# Patient Record
Sex: Male | Born: 2000 | Race: Black or African American | Hispanic: No | Marital: Single | Smoking: Never smoker
Health system: Southern US, Community
[De-identification: ages and names within clinical notes are randomized; demographics above are authoritative.]

## PROBLEM LIST (undated history)

## (undated) DIAGNOSIS — Z87438 Personal history of other diseases of male genital organs: Secondary | ICD-10-CM

## (undated) DIAGNOSIS — J3501 Chronic tonsillitis: Secondary | ICD-10-CM

---
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
## 2018-02-09 DIAGNOSIS — Z7251 High risk heterosexual behavior: Secondary | ICD-10-CM | POA: Diagnosis not present

## 2018-02-09 DIAGNOSIS — Z113 Encounter for screening for infections with a predominantly sexual mode of transmission: Secondary | ICD-10-CM | POA: Diagnosis not present

## 2018-02-09 DIAGNOSIS — Z114 Encounter for screening for human immunodeficiency virus [HIV]: Secondary | ICD-10-CM | POA: Diagnosis not present

## 2018-04-22 DIAGNOSIS — H6693 Otitis media, unspecified, bilateral: Secondary | ICD-10-CM | POA: Diagnosis not present

## 2018-04-22 DIAGNOSIS — J039 Acute tonsillitis, unspecified: Secondary | ICD-10-CM | POA: Diagnosis not present

## 2018-10-29 ENCOUNTER — Encounter (INDEPENDENT_AMBULATORY_CARE_PROVIDER_SITE_OTHER): Payer: Self-pay

## 2018-10-29 ENCOUNTER — Other Ambulatory Visit: Payer: Self-pay | Admitting: Primary Care

## 2018-10-29 ENCOUNTER — Other Ambulatory Visit: Payer: Self-pay

## 2018-10-29 ENCOUNTER — Encounter: Payer: Self-pay | Admitting: Primary Care

## 2018-10-29 ENCOUNTER — Ambulatory Visit (INDEPENDENT_AMBULATORY_CARE_PROVIDER_SITE_OTHER): Payer: No Typology Code available for payment source | Admitting: Primary Care

## 2018-10-29 VITALS — BP 128/74 | HR 48 | Temp 96.7°F | Ht 73.25 in | Wt 242.8 lb

## 2018-10-29 DIAGNOSIS — B081 Molluscum contagiosum: Secondary | ICD-10-CM | POA: Diagnosis not present

## 2018-10-29 DIAGNOSIS — N4889 Other specified disorders of penis: Secondary | ICD-10-CM | POA: Diagnosis not present

## 2018-10-29 DIAGNOSIS — N5089 Other specified disorders of the male genital organs: Secondary | ICD-10-CM | POA: Diagnosis not present

## 2018-10-29 DIAGNOSIS — Z113 Encounter for screening for infections with a predominantly sexual mode of transmission: Secondary | ICD-10-CM | POA: Diagnosis not present

## 2018-10-29 DIAGNOSIS — R238 Other skin changes: Secondary | ICD-10-CM

## 2018-10-29 NOTE — Progress Notes (Signed)
Subjective:    Patient ID: Paul Bridges, male    DOB: [birth year], 18 y.o.   MRN: [id number]  HPI  Paul Bridges is a 18 year old male who presents today to establish care and discuss the problems mentioned below. Will obtain/review records.  1) Molluscum Contagiosum: Diagnosed originally in March 2020 as genital warts at [facility] [employer]. At the health center they attempted cryotherapy which didn't help. He was then treated by the health department with trentoin cream with resolve in the bumps.  He does notice pain to the shaft of his penis after recovering from an erection. This has been since March 2020.  2) Testicular Mass: First noticed in March 2020 with left upper testicular bump. The mass is not painful and has not decreased in size. He has noticed that the left testicle is smaller which is not normal.  Review of Systems  Constitutional: Negative for fever.  Eyes: Negative for visual disturbance.  Respiratory: Negative for shortness of breath.   Cardiovascular: Negative for chest pain.  Gastrointestinal: Negative for abdominal pain.  Endocrine: Negative for polyuria.  Genitourinary: Negative for dysuria, hematuria, penile pain, scrotal swelling and testicular pain.       Dry skin to penile shaft, burning to skin when coming out of an erection.   Skin: Negative for color change and rash.  Allergic/Immunologic: Negative for environmental allergies.  Hematological: Negative for adenopathy.       History reviewed. No pertinent past medical history.   Social History   Socioeconomic History  . Marital status: Single    Spouse name: Not on file  . Number of children: Not on file  . Years of education: Not on file  . Highest education level: Not on file  Occupational History  . Not on file  Social Needs  . Financial resource strain: Not on file  . Food insecurity    Worry: Not on file    Inability: Not on file  . Transportation needs     Medical: Not on file    Non-medical: Not on file  Tobacco Use  . Smoking status: Never Smoker  . Smokeless tobacco: Never Used  Substance and Sexual Activity  . Alcohol use: No  . Drug use: No  . Sexual activity: Never    Birth control/protection: Abstinence  Lifestyle  . Physical activity    Days per week: Not on file    Minutes per session: Not on file  . Stress: Not on file  Relationships  . Social Musicianconnections    Talks on phone: Not on file    Gets together: Not on file    Attends religious service: Not on file    Active member of club or organization: Not on file    Attends meetings of clubs or organizations: Not on file    Relationship status: Not on file  . Intimate partner violence    Fear of current or ex partner: Not on file    Emotionally abused: Not on file    Physically abused: Not on file    Forced sexual activity: Not on file  Other Topics Concern  . Not on file  Social History Narrative   Single.   No children.   Works in Clinical biochemistcustomer service.   [facility] [employer]    History reviewed. No pertinent surgical history.  Family History  Problem Relation Age of Onset  . Diabetes Mother   . Hyperlipidemia Mother   .  Hypertension Mother     No Known Allergies  Current Outpatient Medications on File Prior to Visit  Medication Sig Dispense Refill  . vitamin B-12 (CYANOCOBALAMIN) 1000 MCG tablet Take 1,000 mcg by mouth daily.     No current facility-administered medications on file prior to visit.     BP 128/74 (BP Location: Right Arm, Patient Position: Sitting, Cuff Size: Large)   Pulse (!) 48   Temp (!) 96.7 F (35.9 C) (Tympanic)   Ht 6' 1.25" (1.861 m)   Wt 242 lb 12 oz (110.1 kg)   SpO2 97%   BMI 31.81 kg/m    Objective:   Physical Exam  Constitutional: He is oriented to person, place, and time. He appears well-nourished.  Neck: Neck supple.  Cardiovascular: Normal rate and regular rhythm.  Respiratory: Effort normal and  breath sounds normal.  Genitourinary: Right testis shows no mass, no swelling and no tenderness. Left testis shows no mass, no swelling and no tenderness. No penile erythema. No discharge found.    Genitourinary Comments: No lesions, bumps, dry skin noted to penis. Chaperone present.   Neurological: He is alert and oriented to person, place, and time.  Skin: Skin is warm and dry.  Psychiatric: He has a normal mood and affect.           Assessment & Plan:

## 2018-10-29 NOTE — Assessment & Plan Note (Signed)
Diagnosed earlier in 2020, resolved with Trention per health department. No evidence on exam today.

## 2018-10-29 NOTE — Patient Instructions (Addendum)
Stop by the lab prior to leaving today. I will notify you of your results once received.   You will be contacted regarding your ultrasound.  Please let us know if you have not been contacted within one week.   You will be contacted regarding your referral to dermatology.  Please let us know if you have not been contacted within one week.   It was a pleasure to meet you today! Please don't hesitate to call or message me with any questions. Welcome to [facility]!

## 2018-10-29 NOTE — Assessment & Plan Note (Signed)
Per patient x several months. No obvious masses noted on exam, chaperone present. Ultrasound of testicles pending.

## 2018-10-29 NOTE — Assessment & Plan Note (Signed)
Asymptomatic. Labs pending. 

## 2018-10-29 NOTE — Assessment & Plan Note (Signed)
To the skin of the shaft of his penis when coming out of an erection, also with dry skin of the shaft. Patient wanting to see dermatologist. Referral placed.

## 2018-11-02 LAB — HSV(HERPES SIMPLEX VRS) I + II AB-IGG
HAV 1 IGG,TYPE SPECIFIC AB: <0.9 index
HSV 2 IGG,TYPE SPECIFIC AB: <0.9 index

## 2018-11-02 LAB — HIV ANTIBODY (ROUTINE TESTING W REFLEX): HIV 1&2 Ab, 4th Generation: NONREACTIVE

## 2018-11-02 LAB — C. TRACHOMATIS/N. GONORRHOEAE RNA
C. trachomatis RNA, TMA: NOT DETECTED
N. gonorrhoeae RNA, TMA: NOT DETECTED

## 2018-11-02 LAB — HSV 1/2 AB (IGM), IFA W/RFLX TITER
HSV 1 IgM Screen: NEGATIVE
HSV 2 IgM Screen: NEGATIVE

## 2018-11-02 LAB — RPR: RPR Ser Ql: NONREACTIVE

## 2018-11-05 ENCOUNTER — Other Ambulatory Visit: Payer: Self-pay

## 2018-11-05 ENCOUNTER — Ambulatory Visit (HOSPITAL_COMMUNITY)
Admission: RE | Admit: 2018-11-05 | Discharge: 2018-11-05 | Disposition: A | Discharge status: Home / Self Care | Payer: No Typology Code available for payment source | Source: Ambulatory Visit | Attending: Primary Care | Admitting: Primary Care

## 2018-11-05 DIAGNOSIS — N5089 Other specified disorders of the male genital organs: Secondary | ICD-10-CM | POA: Insufficient documentation

## 2018-11-10 ENCOUNTER — Telehealth: Payer: Self-pay | Admitting: Primary Care

## 2018-11-10 NOTE — Telephone Encounter (Signed)
Best number [phone] Pt returned yoru call

## 2018-11-10 NOTE — Telephone Encounter (Signed)
Addressed in result note.  

## 2018-12-30 ENCOUNTER — Other Ambulatory Visit: Payer: Self-pay

## 2018-12-30 ENCOUNTER — Ambulatory Visit: Payer: Self-pay | Admitting: Nurse Practitioner

## 2018-12-30 ENCOUNTER — Encounter: Payer: Self-pay | Admitting: Nurse Practitioner

## 2018-12-30 DIAGNOSIS — Z113 Encounter for screening for infections with a predominantly sexual mode of transmission: Secondary | ICD-10-CM

## 2018-12-30 LAB — GRAM STAIN

## 2018-12-30 NOTE — Progress Notes (Signed)
Here today for STD screening. Accepts bloodwork. Hal Morales, RN Gram Stain results reviewed. No treatment indicated per standing order. Hal Morales, RN

## 2018-12-30 NOTE — Progress Notes (Signed)
STI clinic/screening visit  Subjective:  Paul Bridges is a 18 y.o. male being seen today for an STI screening visit. The patient reports they do not have symptoms.  Patient has the following medical conditions:   Patient Active Problem List   Diagnosis Date Noted  . Molluscum contagiosum 10/29/2018  . Penile pain 10/29/2018  . Testicular mass 10/29/2018     No chief complaint on file.   Any questions or concerns before we get started - Here for STD testing - denies any symptoms at this time  Client asked the following questions for STD screening:  Allergies: NKDA Previous Surgeries: denies Solvay - denis Medications - deniis Contact to STD - denies Antibiotics in last 2 wks  - denies Last HIV test - 2 months ago Prior STD - denies Immunizations UTD - yes Last sex  - 12/27/2018 X last 2 wks - 5 Partners Last 2 months - 2 - denies condom use Type of sex - penis Smoke - denies ETOH - weekends IVD - denies MJ use - every other day Travel in last 3 months - denies Abuse hx - denies  Last urination - 1 1/2 hrs ago   Patient reports - desire for STD testing  See flowsheet for further details and programmatic requirements.    The following portions of the patient's history were reviewed and updated as appropriate: allergies, current medications, past medical history, past social history, past surgical history and problem list.  Objective:  There were no vitals filed for this visit.  Physical Exam Constitutional:      Appearance: Normal appearance.  HENT:     Head: Normocephalic and atraumatic.     Comments: No nits or hair loss    Mouth/Throat:     Mouth: Mucous membranes are moist.     Pharynx: Oropharynx is clear. No oropharyngeal exudate or posterior oropharyngeal erythema.  Pulmonary:     Effort: Pulmonary effort is normal.  Abdominal:     General: Abdomen is flat.     Palpations: Abdomen is soft. There is no hepatomegaly or mass.     Tenderness:  There is no abdominal tenderness.  Genitourinary:    Pubic Area: No rash or pubic lice.      Penis: Circumcised. Discharge (small amt clear discharge) present.      Scrotum/Testes: Normal.     Epididymis:     Right: Normal.     Left: Normal.  Lymphadenopathy:     Head:     Right side of head: No preauricular or posterior auricular adenopathy.     Left side of head: No preauricular or posterior auricular adenopathy.     Cervical: No cervical adenopathy.     Upper Body:     Right upper body: No supraclavicular or axillary adenopathy.     Left upper body: No supraclavicular or axillary adenopathy.     Lower Body: No right inguinal adenopathy. No left inguinal adenopathy.  Skin:    General: Skin is warm and dry.     Findings: No rash.  Neurological:     Mental Status: He is alert and oriented to person, place, and time.  Psychiatric:        Behavior: Behavior is cooperative.       Assessment and Plan:  Paul Nashton Belson is a 18 y.o. male presenting to the [facility] Department for STI screening  1. Screening examination for STD (sexually transmitted disease) Await STD results  - Gram stain - please  treat per standing order - Chlamydia/Gonorrhea [facility] - Syphilis Serology, [facility] - Gonococcus culture - HIV/HCV [facility]   No follow-ups on file.  No future appointments.  Donn PieriniKarla W Ayden Hardwick, NP

## 2019-01-03 LAB — GONOCOCCUS CULTURE

## 2019-01-26 NOTE — Progress Notes (Signed)
NP did not collect GC/CT. I have removed this from the orders.

## 2019-01-26 NOTE — Addendum Note (Signed)
Addended by: Caryl Bis on: 01/26/2019 03:02 PM   Modules accepted: Orders

## 2019-02-02 ENCOUNTER — Ambulatory Visit: Payer: Self-pay | Admitting: Family Medicine

## 2019-02-02 ENCOUNTER — Encounter: Payer: Self-pay | Admitting: Family Medicine

## 2019-02-02 ENCOUNTER — Other Ambulatory Visit: Payer: Self-pay

## 2019-02-02 DIAGNOSIS — Z113 Encounter for screening for infections with a predominantly sexual mode of transmission: Secondary | ICD-10-CM

## 2019-02-02 LAB — GRAM STAIN

## 2019-02-02 NOTE — Progress Notes (Signed)
    STI clinic/screening visit  Subjective:  Paul Bridges is a 18 y.o. male being seen today for an STI screening visit. The patient reports they do not have symptoms.  Patient has the following medical conditions:   Patient Active Problem List   Diagnosis Date Noted  . Testicular mass 10/29/2018     Chief Complaint  Patient presents with  . SEXUALLY TRANSMITTED DISEASE    HPI  Patient reports here for STD screening.  Denies sympts.  See flowsheet for further details and programmatic requirements.    The following portions of the patient's history were reviewed and updated as appropriate: allergies, current medications, past medical history, past social history, past surgical history and problem list.  Objective:  There were no vitals filed for this visit.  Physical Exam Constitutional:      Appearance: Normal appearance.  HENT:     Mouth/Throat:     Pharynx: Oropharynx is clear.  Neck:     Vascular: No carotid bruit.  Abdominal:     Tenderness: There is no abdominal tenderness.  Genitourinary:    Penis: Normal.      Scrotum/Testes: Normal.     Comments: No inguinal adenopathy R testicle -smooth, no mass palpated Lymphadenopathy:     Cervical: No cervical adenopathy.  Skin:    General: Skin is warm and dry.     Findings: No lesion or rash.  Neurological:     Mental Status: He is alert.    Assessment and Plan:  Paul Bridges is a 18 y.o. male presenting to the [facility] Department for STI screening  1. Screening examination for venereal disease  - HIV [place] [facility] - Syphilis Serology, [employer] [facility] - Gram stain - Gonococcus culture Co. To use condoms always     No follow-ups on file.  No future appointments.  Hassell Done, FNP

## 2019-02-02 NOTE — Progress Notes (Signed)
Here today for STD screening. Accepts bloodwork. Hal Morales, RN Gram Stain results reviewed. No treatment indicated per standing orders. Hal Morales, RN

## 2019-02-07 LAB — GONOCOCCUS CULTURE

## 2019-03-04 ENCOUNTER — Other Ambulatory Visit: Payer: Self-pay

## 2019-03-04 ENCOUNTER — Encounter: Payer: Self-pay | Admitting: Physician Assistant

## 2019-03-04 ENCOUNTER — Ambulatory Visit: Payer: Self-pay | Admitting: Physician Assistant

## 2019-03-04 DIAGNOSIS — Z113 Encounter for screening for infections with a predominantly sexual mode of transmission: Secondary | ICD-10-CM

## 2019-03-04 LAB — GRAM STAIN

## 2019-03-04 NOTE — Progress Notes (Signed)
    STI clinic/screening visit  Subjective:  Paul Bridges is a 18 y.o. male being seen today for an STI screening visit. The patient reports they do not have symptoms.  Patient has the following medical conditions:   Patient Active Problem List   Diagnosis Date Noted  . Testicular mass 10/29/2018     Chief Complaint  Patient presents with  . Exposure to STD    HPI  Patient reports desire for STI screen only. Declines bloodwork.  See flowsheet for further details and programmatic requirements.    The following portions of the patient's history were reviewed and updated as appropriate: allergies, current medications, past medical history, past social history, past surgical history and problem list.  Objective:  There were no vitals filed for this visit.  Physical Exam Constitutional:      Appearance: He is obese.  HENT:     Mouth/Throat:     Mouth: Mucous membranes are moist.     Pharynx: Oropharynx is clear. No oropharyngeal exudate or posterior oropharyngeal erythema.  Pulmonary:     Effort: Pulmonary effort is normal.  Abdominal:     Palpations: Abdomen is soft. There is no mass.     Tenderness: There is no abdominal tenderness.  Genitourinary:    Penis: Normal and circumcised. No tenderness, discharge or swelling.      Scrotum/Testes: Normal.     Epididymis:     Right: Normal.     Left: Normal.  Skin:    General: Skin is warm and dry.     Findings: No rash.  Neurological:     Mental Status: He is alert and oriented to person, place, and time.  Psychiatric:        Mood and Affect: Mood normal.        Behavior: Behavior normal.       Assessment and Plan:  Paul Bridges is a 18 y.o. male presenting to the [facility] Department for STI screening  1. Routine screening for STI (sexually transmitted infection) Gram stain neg. Await GG culture. Strongly recommend condoms for STI prevention and to decrease risk of unwanted  pregnancy. - Gram stain - Gonococcus culture     Return in about 6 months (around 09/02/2019), or routine STI testing, prior if exposure/sx.  No future appointments.  Lora Havens, PA-C

## 2019-03-04 NOTE — Progress Notes (Signed)
Patient here for STD testing.Helia Haese Brewer-Jensen, RN 

## 2019-03-04 NOTE — Progress Notes (Signed)
Gram stain reviewed, no treatment indicated..Makayleigh Poliquin Brewer-Jensen, RN  ?

## 2019-03-09 LAB — GONOCOCCUS CULTURE

## 2019-05-06 ENCOUNTER — Ambulatory Visit: Payer: No Typology Code available for payment source

## 2019-05-11 ENCOUNTER — Encounter: Payer: Self-pay | Admitting: Physician Assistant

## 2019-05-11 ENCOUNTER — Ambulatory Visit: Payer: Self-pay | Admitting: Physician Assistant

## 2019-05-11 ENCOUNTER — Other Ambulatory Visit: Payer: Self-pay

## 2019-05-11 DIAGNOSIS — Z113 Encounter for screening for infections with a predominantly sexual mode of transmission: Secondary | ICD-10-CM

## 2019-05-11 LAB — GRAM STAIN

## 2019-05-11 NOTE — Progress Notes (Signed)
Gram stain reviewed and is negative today, so no treatment needed for gram stain per standing order. Provider orders completed.Damani Kelemen, RN 

## 2019-05-11 NOTE — Progress Notes (Signed)
   [facility] Department STI clinic/screening visit  Subjective:  Paul Bridges is a 19 y.o. male being seen today for an STI screening visit. The patient reports they do not have symptoms.    Patient has the following medical conditions:   Patient Active Problem List   Diagnosis Date Noted  . Testicular mass 10/29/2018     Chief Complaint  Patient presents with  . SEXUALLY TRANSMITTED DISEASE    HPI  Patient reports that he does not have any symptoms but would like to have a screening today.   See flowsheet for further details and programmatic requirements.    The following portions of the patient's history were reviewed and updated as appropriate: allergies, current medications, past medical history, past social history, past surgical history and problem list.  Objective:  There were no vitals filed for this visit.  Physical Exam Constitutional:      General: He is not in acute distress.    Appearance: Normal appearance. He is normal weight.  HENT:     Head: Normocephalic and atraumatic.     Comments: No nits, lice, or hair loss. No cervical, supraclavicular or axillary adenopathy.    Mouth/Throat:     Mouth: Mucous membranes are moist.     Pharynx: Oropharynx is clear. No oropharyngeal exudate or posterior oropharyngeal erythema.  Eyes:     Conjunctiva/sclera: Conjunctivae normal.  Pulmonary:     Effort: Pulmonary effort is normal.  Abdominal:     Palpations: Abdomen is soft. There is no mass.     Tenderness: There is no abdominal tenderness. There is no guarding or rebound.  Genitourinary:    Penis: Normal.      Testes: Normal.     Comments: Pubic area without nits, lice, edema, erythema, lesions and inguinal adenopathy. Penis without lesions, circumcised and without discharge at meatus. Musculoskeletal:     Cervical back: Neck supple. No tenderness.  Skin:    General: Skin is warm and dry.     Findings: No bruising, erythema, lesion  or rash.  Neurological:     Mental Status: He is alert and oriented to person, place, and time.  Psychiatric:        Mood and Affect: Mood normal.        Behavior: Behavior normal.        Thought Content: Thought content normal.        Judgment: Judgment normal.       Assessment and Plan:  Paul Bridges is a 19 y.o. male presenting to the [facility] Department for STI screening  1. Screening for STD (sexually transmitted disease) Patient into clinic without symptoms. Rec condoms with all sex. Await test results.  Counseled that RN will call if needs to RTC for treatment once results are back. - Gram stain - Gonococcus culture - HIV [place] [facility] - Syphilis Serology,  Lab - Gonococcus culture     No follow-ups on file.  No future appointments.  Matt Holmes, PA

## 2019-05-11 NOTE — Progress Notes (Signed)
Pt here for STD screening.Hadasah Brugger, RN 

## 2019-05-16 LAB — GONOCOCCUS CULTURE

## 2019-07-28 ENCOUNTER — Ambulatory Visit: Payer: No Typology Code available for payment source

## 2019-08-02 ENCOUNTER — Other Ambulatory Visit: Payer: Self-pay

## 2019-08-02 ENCOUNTER — Encounter: Payer: Self-pay | Admitting: Family Medicine

## 2019-08-02 ENCOUNTER — Ambulatory Visit: Payer: Self-pay | Admitting: Family Medicine

## 2019-08-02 DIAGNOSIS — Z113 Encounter for screening for infections with a predominantly sexual mode of transmission: Secondary | ICD-10-CM

## 2019-08-02 LAB — GRAM STAIN

## 2019-08-02 NOTE — Progress Notes (Signed)
[facility] Department STI clinic/screening visit  Subjective:  Paul Bridges is a 19 y.o. male being seen today for an STI screening visit. The patient reports they do not have symptoms.    Patient has the following medical conditions:   Patient Active Problem List   Diagnosis Date Noted  . Testicular mass 10/29/2018     Chief Complaint  Patient presents with  . SEXUALLY TRANSMITTED DISEASE    STD screening including bloodwork    HPI  Patient reports no sx. Wants to be checked due to a new partner last week.    See flowsheet for further details and programmatic requirements.    The following portions of the patient's history were reviewed and updated as appropriate: allergies, current medications, past medical history, past social history, past surgical history and problem list.  Objective:  There were no vitals filed for this visit.  Physical Exam Constitutional:      Appearance: Normal appearance.  HENT:     Head: Normocephalic and atraumatic.     Comments: No nits or hair loss    Mouth/Throat:     Mouth: Mucous membranes are moist.     Pharynx: Oropharynx is clear. No oropharyngeal exudate or posterior oropharyngeal erythema.  Pulmonary:     Effort: Pulmonary effort is normal.  Abdominal:     General: Abdomen is flat.     Palpations: Abdomen is soft. There is no hepatomegaly or mass.     Tenderness: There is no abdominal tenderness.  Genitourinary:    Pubic Area: No rash or pubic lice.      Penis: Normal.      Testes: Cremasteric reflex is present.        Right: Mass (known mass-- elvaluated in the past. feels likes varicoceole.) present. Tenderness or swelling not present.        Left: Mass, tenderness or swelling not present.     Epididymis:     Right: Normal.     Left: Normal.     Rectum: Normal.    Lymphadenopathy:     Head:     Right side of head: No preauricular or posterior auricular adenopathy.     Left side of head: No  preauricular or posterior auricular adenopathy.     Cervical: No cervical adenopathy.     Upper Body:     Right upper body: No supraclavicular or axillary adenopathy.     Left upper body: No supraclavicular or axillary adenopathy.     Lower Body: No right inguinal adenopathy. No left inguinal adenopathy.  Skin:    General: Skin is warm and dry.     Findings: No rash.  Neurological:     Mental Status: He is alert and oriented to person, place, and time.       Assessment and Plan:  Paul Bridges is a 19 y.o. male presenting to the [facility] Department for STI screening  1. Screening for STD (sexually transmitted disease) Treat gram stain per standing  Reviewed condom use  Discussed transmission of STIs. Patient was asking about "how long to wait between partners to know you dont have something." and I counseled the only safety between partners is using condoms and/or having testing frequently.   Also discussed that gram stain and GC culture help confirm no gonorrhea but our testing does not include confirmation for chlamydia.  - Gram stain - HIV [place] [facility] - Syphilis Serology, [place] [facility] - Gonococcus culture - Gonococcus culture  Return if symptoms worsen or fail to improve.  No future appointments.  Caren Macadam, MD

## 2019-08-02 NOTE — Progress Notes (Signed)
Gram stain reviewed, no tx per standing order. Provider orders completed. 

## 2019-08-06 ENCOUNTER — Other Ambulatory Visit: Payer: Self-pay

## 2019-08-06 ENCOUNTER — Ambulatory Visit (INDEPENDENT_AMBULATORY_CARE_PROVIDER_SITE_OTHER): Payer: 59 | Admitting: Otolaryngology

## 2019-08-06 ENCOUNTER — Encounter (INDEPENDENT_AMBULATORY_CARE_PROVIDER_SITE_OTHER): Payer: Self-pay | Admitting: Otolaryngology

## 2019-08-06 VITALS — Temp 97.9°F

## 2019-08-06 DIAGNOSIS — J3501 Chronic tonsillitis: Secondary | ICD-10-CM | POA: Diagnosis not present

## 2019-08-06 LAB — GONOCOCCUS CULTURE

## 2019-08-06 NOTE — Progress Notes (Signed)
HPI: Paul Bridges is a 19 y.o. male who presents for evaluation of chronic tonsil problems.  He frequently gets tonsiloliths as well as white debris buildup in the tonsils and occasional sore throats.  He has always had large tonsils at one point was going to have his tonsils removed.  He complains of bad breath as well as frequent tonsil stones and intermittent sore throats. He is otherwise healthy on no medications.  No past medical history on file. No past surgical history on file. Social History   Socioeconomic History  . Marital status: Single    Spouse name: Not on file  . Number of children: Not on file  . Years of education: Not on file  . Highest education level: Not on file  Occupational History  . Not on file  Tobacco Use  . Smoking status: Never Smoker  . Smokeless tobacco: Never Used  Substance and Sexual Activity  . Alcohol use: No  . Drug use: Yes    Types: Marijuana  . Sexual activity: Never    Birth control/protection: Abstinence  Other Topics Concern  . Not on file  Social History Narrative   Single.   No children.   Works in Clinical biochemist.   Student at [employer]   Social Determinants of Health   Financial Resource Strain:   . Difficulty of Paying Living Expenses:   Food Insecurity:   . Worried About Programme researcher, broadcasting/film/video in the Last Year:   . Barista in the Last Year:   Transportation Needs:   . Freight forwarder (Medical):   Marland Kitchen Lack of Transportation (Non-Medical):   Physical Activity:   . Days of Exercise per Week:   . Minutes of Exercise per Session:   Stress:   . Feeling of Stress :   Social Connections:   . Frequency of Communication with Friends and Family:   . Frequency of Social Gatherings with Friends and Family:   . Attends Religious Services:   . Active Member of Clubs or Organizations:   . Attends Banker Meetings:   Marland Kitchen Marital Status:    Family History  Problem Relation Age of Onset   . Diabetes Mother   . Hyperlipidemia Mother   . Hypertension Mother    No Known Allergies Prior to Admission medications   Medication Sig Start Date End Date Taking? Authorizing Provider  Ascorbic Acid (VITAMIN C) 100 MG tablet Take 100 mg by mouth daily.   Yes [provider]  cholecalciferol (VITAMIN D3) 25 MCG (1000 UNIT) tablet Take 1,000 Units by mouth daily.   Yes [provider]  vitamin B-12 (CYANOCOBALAMIN) 1000 MCG tablet Take 1,000 mcg by mouth daily.   Yes [provider]     Positive ROS: Otherwise negative  All other systems have been reviewed and were otherwise negative with the exception of those mentioned in the HPI and as above.  Physical Exam: Constitutional: Alert, well-appearing, no acute distress Ears: External ears without lesions or tenderness. Ear canals are clear bilaterally with intact, clear TMs.  Nasal: External nose without lesions. Septum midline. Clear nasal passages Oral: Lips and gums without lesions. Tongue and palate mucosa without lesions. Posterior oropharynx clear.  Patient with symmetric large 2-3+ size tonsils bilaterally with tonsillar crypts and small tonsillar stones and tonsillar debris. Neck: No palpable adenopathy or masses Respiratory: Breathing comfortably.  Lungs clear to auscultation. Cardiac exam: Regular rate and rhythm without murmur. Skin: No facial/neck lesions  or rash noted.  Procedures  Assessment: Chronic tonsillitis  Plan: Discussed tonsillectomy with the patient today in the office.  Discussed the morbidity and risk of tonsillectomy including postop bleeding. He would like to schedule tonsillectomy.  He is presently in school taking online classes.  Reviewed with him that he will have to be out of school for 8 to 9 days to recover. He will schedule this with our nurse.  Radene Journey, MD

## 2019-08-10 ENCOUNTER — Ambulatory Visit (INDEPENDENT_AMBULATORY_CARE_PROVIDER_SITE_OTHER): Payer: Self-pay | Admitting: Otolaryngology

## 2019-08-10 DIAGNOSIS — J3501 Chronic tonsillitis: Secondary | ICD-10-CM

## 2019-08-10 NOTE — H&P (Signed)
PREOPERATIVE H&P  Chief Complaint: Chronic tonsil problems  HPI: Paul Bridges is a 19 y.o. male who presents for evaluation of chronic tonsil problems.  Patient has frequent tonsil stones.  He is always had large tonsils and has had frequent sore throats.  He frequently cleans the tonsil stones by they keep recurring and cause sore throats as well as a bad breath.  He is taken to the operating room at this time for tonsillectomy.  No past medical history on file. No past surgical history on file. Social History   Socioeconomic History  . Marital status: Single    Spouse name: Not on file  . Number of children: Not on file  . Years of education: Not on file  . Highest education level: Not on file  Occupational History  . Not on file  Tobacco Use  . Smoking status: Never Smoker  . Smokeless tobacco: Never Used  Substance and Sexual Activity  . Alcohol use: No  . Drug use: Yes    Types: Marijuana  . Sexual activity: Never    Birth control/protection: Abstinence  Other Topics Concern  . Not on file  Social History Narrative   Single.   No children.   Works in Clinical biochemist.   Student at [employer]   Social Determinants of Health   Financial Resource Strain:   . Difficulty of Paying Living Expenses:   Food Insecurity:   . Worried About Programme researcher, broadcasting/film/video in the Last Year:   . Barista in the Last Year:   Transportation Needs:   . Freight forwarder (Medical):   Marland Kitchen Lack of Transportation (Non-Medical):   Physical Activity:   . Days of Exercise per Week:   . Minutes of Exercise per Session:   Stress:   . Feeling of Stress :   Social Connections:   . Frequency of Communication with Friends and Family:   . Frequency of Social Gatherings with Friends and Family:   . Attends Religious Services:   . Active Member of Clubs or Organizations:   . Attends Banker Meetings:   Marland Kitchen Marital Status:    Family History  Problem Relation  Age of Onset  . Diabetes Mother   . Hyperlipidemia Mother   . Hypertension Mother    No Known Allergies Prior to Admission medications   Medication Sig Start Date End Date Taking? Authorizing Provider  Ascorbic Acid (VITAMIN C) 100 MG tablet Take 100 mg by mouth daily.    [provider]  cholecalciferol (VITAMIN D3) 25 MCG (1000 UNIT) tablet Take 1,000 Units by mouth daily.    [provider]  vitamin B-12 (CYANOCOBALAMIN) 1000 MCG tablet Take 1,000 mcg by mouth daily.    [provider]     Positive ROS: Otherwise negative  All other systems have been reviewed and were otherwise negative with the exception of those mentioned in the HPI and as above.  Physical Exam: There were no vitals filed for this visit.  General: Alert, no acute distress Oral: Normal oral mucosa and tonsils are 2-3+ bilaterally with tonsillar crypts and white debris. Nasal: Clear nasal passages Neck: No palpable adenopathy or thyroid nodules Ear: Ear canal is clear with normal appearing TMs Cardiovascular: Regular rate and rhythm, no murmur.  Respiratory: Clear to auscultation Neurologic: Alert and oriented x 3   Assessment/Plan: Chronic tonsillitis  Plan for tonsillectomy   Dillard Cannon, MD 08/10/2019 6:35 PM

## 2019-08-16 ENCOUNTER — Other Ambulatory Visit (HOSPITAL_COMMUNITY): Payer: 59 | Attending: Otolaryngology

## 2019-08-16 ENCOUNTER — Encounter (HOSPITAL_BASED_OUTPATIENT_CLINIC_OR_DEPARTMENT_OTHER): Payer: Self-pay | Admitting: Otolaryngology

## 2019-08-17 ENCOUNTER — Encounter (INDEPENDENT_AMBULATORY_CARE_PROVIDER_SITE_OTHER): Payer: Self-pay

## 2019-08-17 NOTE — Progress Notes (Unsigned)
Pt was scheduled for Tonsillectomy on 08/19/2019. This had to be canceled due to pt would not return call to schedule pre-op and COVID testing before surgery. With several attemps to reach pt from my self and Lennox Laity at [facility] for over 2 days, surgery was canceled. Lennox Laity had spoke with pt early on and he stated he was not sure if wanted to get surgery or not and he would call her back to let her know, he never called back. No message were able to be left due to pt had no voice mail box set up. I did call mom's # and e-mailed her at work,[employer], to make sure pt is ok, no reply from her either. PM

## 2019-08-19 ENCOUNTER — Ambulatory Visit (HOSPITAL_BASED_OUTPATIENT_CLINIC_OR_DEPARTMENT_OTHER): Admission: RE | Admit: 2019-08-19 | Payer: 59 | Source: Ambulatory Visit | Admitting: Otolaryngology

## 2019-08-19 ENCOUNTER — Encounter (HOSPITAL_BASED_OUTPATIENT_CLINIC_OR_DEPARTMENT_OTHER): Admission: RE | Payer: Self-pay | Source: Ambulatory Visit

## 2019-08-19 HISTORY — DX: Chronic tonsillitis: J35.01

## 2019-08-19 HISTORY — DX: Personal history of other diseases of male genital organs: Z87.438

## 2019-08-19 SURGERY — TONSILLECTOMY
Anesthesia: General

## 2019-09-03 ENCOUNTER — Encounter: Payer: Self-pay | Admitting: Family Medicine

## 2019-09-03 ENCOUNTER — Other Ambulatory Visit: Payer: Self-pay

## 2019-09-03 ENCOUNTER — Ambulatory Visit: Payer: Self-pay | Admitting: Family Medicine

## 2019-09-03 DIAGNOSIS — N341 Nonspecific urethritis: Secondary | ICD-10-CM

## 2019-09-03 DIAGNOSIS — Z113 Encounter for screening for infections with a predominantly sexual mode of transmission: Secondary | ICD-10-CM

## 2019-09-03 LAB — GRAM STAIN

## 2019-09-03 MED ORDER — AZITHROMYCIN 500 MG PO TABS
1000.0000 mg | ORAL_TABLET | Freq: Once | ORAL | Status: AC
  Administered 2019-09-03: 1000 mg via ORAL

## 2019-09-03 MED ORDER — AZITHROMYCIN 500 MG PO TABS: 500.0000 mg | ORAL_TABLET | Freq: Once | ORAL | Status: DC

## 2019-09-03 NOTE — Progress Notes (Signed)
  [facility] Department STI clinic/screening visit  Subjective:  Paul Bridges is a 19 y.o. male being seen today for  Chief Complaint  Patient presents with  . SEXUALLY TRANSMITTED DISEASE     The patient reports they do not have symptoms.   Patient has the following medical conditions:   Patient Active Problem List   Diagnosis Date Noted  . Testicular mass 10/29/2018    HPI  Pt reports he is here for STI screening, denies symptoms.   See flowsheet for further details and programmatic requirements.    No components found for: HCV  The following portions of the patient's history were reviewed and updated as appropriate: allergies, current medications, past medical history, past social history, past surgical history and problem list.  Objective:  There were no vitals filed for this visit.   Physical Exam Constitutional:      Appearance: Normal appearance.  HENT:     Head: Normocephalic and atraumatic.     Comments: No nits or hair loss    Mouth/Throat:     Mouth: Mucous membranes are moist.     Pharynx: Oropharynx is clear. No oropharyngeal exudate or posterior oropharyngeal erythema.  Pulmonary:     Effort: Pulmonary effort is normal.  Abdominal:     General: Abdomen is flat.     Palpations: Abdomen is soft. There is no hepatomegaly or mass.     Tenderness: There is no abdominal tenderness.  Genitourinary:    Pubic Area: No rash or pubic lice.      Penis: Normal and circumcised.      Testes:        Right: Mass:       Epididymis:     Right: Normal.     Left: Normal.     Rectum: Normal.  Lymphadenopathy:     Head:     Right side of head: No preauricular or posterior auricular adenopathy.     Left side of head: No preauricular or posterior auricular adenopathy.     Cervical: No cervical adenopathy.     Upper Body:     Right upper body: No supraclavicular or axillary adenopathy.     Left upper body: No supraclavicular or axillary  adenopathy.     Lower Body: No right inguinal adenopathy. No left inguinal adenopathy.  Skin:    General: Skin is warm and dry.     Findings: No rash.  Neurological:     Mental Status: He is alert and oriented to person, place, and time.       Assessment and Plan:  Paul Ramsey Guadamuz is a 19 y.o. male presenting to the [facility] Department for STI screening    1. Screening examination for venereal disease -Pt without symptoms. Screenings today as below. Treat gram stain per standing order. -Patient does meet criteria for HepB, HepC Screening. Declines these screenings as well as HIV and syphilis. -Testicular mass noted on problem list but not present on exam today. Testicular US 11/2018 showed 5 mm epididymal cysts versus tiny spermatocele at head of LEFT Epididymis. Small LEFT hydrocele. -Counseled on warning s/sx and when to seek care. Recommended condom use with all sex and discussed importance of condom use for STI prevention. - Gram stain - Gonococcus culture    Return for screening as needed.  No future appointments.  Ann Held, PA-C

## 2019-09-03 NOTE — Progress Notes (Signed)
Gram stain reviewed and treated for NGU per standing orders. Jossie Ng, RN

## 2019-09-07 DIAGNOSIS — Z113 Encounter for screening for infections with a predominantly sexual mode of transmission: Secondary | ICD-10-CM | POA: Diagnosis not present

## 2019-09-07 DIAGNOSIS — Z7253 High risk bisexual behavior: Secondary | ICD-10-CM | POA: Diagnosis not present

## 2019-09-07 DIAGNOSIS — Z7189 Other specified counseling: Secondary | ICD-10-CM | POA: Diagnosis not present

## 2019-09-08 LAB — GONOCOCCUS CULTURE

## 2019-09-21 ENCOUNTER — Ambulatory Visit: Payer: 59 | Admitting: Primary Care

## 2019-09-21 ENCOUNTER — Other Ambulatory Visit: Payer: Self-pay

## 2019-09-21 ENCOUNTER — Encounter: Payer: Self-pay | Admitting: Primary Care

## 2019-09-21 VITALS — BP 124/76 | HR 61 | Temp 96.0°F | Resp 98 | Ht 73.25 in | Wt 254.0 lb

## 2019-09-21 DIAGNOSIS — Z113 Encounter for screening for infections with a predominantly sexual mode of transmission: Secondary | ICD-10-CM | POA: Diagnosis not present

## 2019-09-21 NOTE — Progress Notes (Signed)
Subjective:    Patient ID: Paul Bridges, male    DOB: 2001/03/12, 19 y.o.   MRN: [id number]  HPI  This visit occurred during the SARS-CoV-2 public health emergency.  Safety protocols were in place, including screening questions prior to the visit, additional usage of staff PPE, and extensive cleaning of exam room while observing appropriate contact time as indicated for disinfecting solutions.   Mr. Paul Bridges is a 19 year old male who presents today for STD screening.   He is sexually active with the same partner, but his partner has been sexually active with someone else. He sometimes wears protection with condoms.   He denies symptoms of penile discharge, penile swelling, dysuria, etc. He had blood work for Syphilis, HIV completed at the health department in late April 2021.  BP Readings from Last 3 Encounters:  09/21/19 124/76  10/29/18 128/74  12/14/15 113/55     Review of Systems  Constitutional: Negative for fever.  Gastrointestinal: Negative for abdominal pain.  Genitourinary: Negative for dysuria, frequency, hematuria, penile pain and penile swelling.       Past Medical History:  Diagnosis Date  . Chronic tonsillitis   . H/O testicular mass      Social History   Socioeconomic History  . Marital status: Single    Spouse name: Not on file  . Number of children: Not on file  . Years of education: Not on file  . Highest education level: Not on file  Occupational History  . Not on file  Tobacco Use  . Smoking status: Never Smoker  . Smokeless tobacco: Never Used  Substance and Sexual Activity  . Alcohol use: No  . Drug use: Not Currently    Types: Marijuana    Comment: once every couple months  . Sexual activity: Yes    Partners: Female    Birth control/protection: Condom  Other Topics Concern  . Not on file  Social History Narrative   Single.   No children.   Works in Clinical biochemist.   Student at [employer]   Social Determinants of  Health   Financial Resource Strain:   . Difficulty of Paying Living Expenses:   Food Insecurity:   . Worried About Programme researcher, broadcasting/film/video in the Last Year:   . Barista in the Last Year:   Transportation Needs:   . Freight forwarder (Medical):   Marland Kitchen Lack of Transportation (Non-Medical):   Physical Activity:   . Days of Exercise per Week:   . Minutes of Exercise per Session:   Stress:   . Feeling of Stress :   Social Connections:   . Frequency of Communication with Friends and Family:   . Frequency of Social Gatherings with Friends and Family:   . Attends Religious Services:   . Active Member of Clubs or Organizations:   . Attends Banker Meetings:   Marland Kitchen Marital Status:   Intimate Partner Violence:   . Fear of Current or Ex-Partner:   . Emotionally Abused:   Marland Kitchen Physically Abused:   . Sexually Abused:     No past surgical history on file.  Family History  Problem Relation Age of Onset  . Diabetes Mother   . Hyperlipidemia Mother   . Hypertension Mother     No Known Allergies  Current Outpatient Medications on File Prior to Visit  Medication Sig Dispense Refill  . Ascorbic Acid (VITAMIN C) 100 MG tablet Take 100 mg by mouth  daily.    . cholecalciferol (VITAMIN D3) 25 MCG (1000 UNIT) tablet Take 1,000 Units by mouth daily.    . vitamin B-12 (CYANOCOBALAMIN) 1000 MCG tablet Take 1,000 mcg by mouth daily.     No current facility-administered medications on file prior to visit.    BP 124/76   Pulse 61   Temp (!) 96 F (35.6 C) (Temporal)   Resp (!) 98   Ht 6' 1.25" (1.861 m)   Wt 254 lb (115.2 kg)   BMI 33.28 kg/m    Objective:   Physical Exam  Constitutional: He appears well-nourished.  Cardiovascular: Normal rate and regular rhythm.  Respiratory: Effort normal and breath sounds normal.  Musculoskeletal:     Cervical back: Neck supple.  Skin: Skin is warm and dry.  Psychiatric: He has a normal mood and affect.           Assessment  & Plan:

## 2019-09-21 NOTE — Patient Instructions (Signed)
Stop by the lab prior to leaving today. I will notify you of your results once received.   It was a pleasure to see you today!  

## 2019-09-21 NOTE — Assessment & Plan Note (Signed)
Asymptomatic.  Encouraged use of condoms each time he is sexually active.  Recent lab testing reviewed including HIV, RPR. Urine gonorrhea, chlamydia, trichomonas pending per patient request.

## 2019-09-23 LAB — C. TRACHOMATIS/N. GONORRHOEAE RNA
C. trachomatis RNA, TMA: NOT DETECTED
N. gonorrhoeae RNA, TMA: NOT DETECTED

## 2019-09-23 LAB — TRICHOMONAS VAGINALIS RNA, QL,MALES: Trichomonas vaginalis RNA: NOT DETECTED

## 2019-09-24 ENCOUNTER — Encounter: Payer: Self-pay | Admitting: *Deleted

## 2019-11-01 ENCOUNTER — Ambulatory Visit: Payer: Self-pay | Admitting: Physician Assistant

## 2019-11-01 ENCOUNTER — Other Ambulatory Visit: Payer: Self-pay

## 2019-11-01 DIAGNOSIS — Z113 Encounter for screening for infections with a predominantly sexual mode of transmission: Secondary | ICD-10-CM

## 2019-11-01 NOTE — Progress Notes (Signed)
Gram stain reviewed, no treatment indicated..Paul Conyer Brewer-Jensen, RN  ?

## 2019-11-02 ENCOUNTER — Encounter: Payer: Self-pay | Admitting: Physician Assistant

## 2019-11-02 LAB — GRAM STAIN

## 2019-11-02 NOTE — Progress Notes (Signed)
   [facility] Department STI clinic/screening visit  Subjective:  Paul Bridges is a 19 y.o. male being seen today for an STI screening visit. The patient reports they do not have symptoms.    Patient has the following medical conditions:   Patient Active Problem List   Diagnosis Date Noted  . Testicular mass 10/29/2018  . Screening for STD (sexually transmitted disease) 10/29/2018     Chief Complaint  Patient presents with  . SEXUALLY TRANSMITTED DISEASE    screening    HPI  Patient reports that he does not have any symptoms but would like a screening today.  Denies chronic conditions, surgeries and regular medicines.  States last HIV test was 05/2019.  Last void prior to sample collection for Gram stain was over 2 hr ago per patient.    See flowsheet for further details and programmatic requirements.    The following portions of the patient's history were reviewed and updated as appropriate: allergies, current medications, past medical history, past social history, past surgical history and problem list.  Objective:  There were no vitals filed for this visit.  Physical Exam Constitutional:      General: He is not in acute distress.    Appearance: Normal appearance.  HENT:     Head: Normocephalic and atraumatic.     Comments: No nits, lice or hair loss. No cervical, supraclavicular or axillary adenopathy.    Mouth/Throat:     Mouth: Mucous membranes are moist.     Pharynx: Oropharynx is clear. No oropharyngeal exudate or posterior oropharyngeal erythema.  Eyes:     Conjunctiva/sclera: Conjunctivae normal.  Pulmonary:     Effort: Pulmonary effort is normal.  Abdominal:     Palpations: Abdomen is soft. There is no mass.     Tenderness: There is no abdominal tenderness. There is no guarding or rebound.  Genitourinary:    Penis: Normal.      Testes: Normal.     Comments: Pubic area without nits, lice, edema, erythema, lesions and inguinal  adenopathy. Penis circumcised, without rash, lesions and discharge at meatus. Musculoskeletal:     Cervical back: Neck supple. No tenderness.  Skin:    General: Skin is warm and dry.     Findings: No bruising, erythema, lesion or rash.  Neurological:     Mental Status: He is alert and oriented to person, place, and time.  Psychiatric:        Mood and Affect: Mood normal.        Behavior: Behavior normal.        Thought Content: Thought content normal.        Judgment: Judgment normal.       Assessment and Plan:  Paul Bridges is a 19 y.o. male presenting to the [facility] Department for STI screening  1. Screening for STD (sexually transmitted disease) Patient into clinic without symptoms. Rec condoms with all sex. Await test results.  Counseled that RN will call if needs to RTC for treatment once results are back. - Gram stain - Gonococcus culture - HIV [place] [facility] - Syphilis Serology, [place] [facility] - Gonococcus culture     No follow-ups on file.  No future appointments.  Matt Holmes, PA

## 2019-11-06 LAB — GONOCOCCUS CULTURE

## 2019-11-26 ENCOUNTER — Ambulatory Visit: Payer: Self-pay | Admitting: Physician Assistant

## 2019-11-26 ENCOUNTER — Other Ambulatory Visit: Payer: Self-pay

## 2019-11-26 DIAGNOSIS — Z113 Encounter for screening for infections with a predominantly sexual mode of transmission: Secondary | ICD-10-CM

## 2019-11-26 NOTE — Progress Notes (Signed)
Gram stain reviewed, no tx per provider orders. Provider orders completed. 

## 2019-11-27 ENCOUNTER — Encounter: Payer: Self-pay | Admitting: Physician Assistant

## 2019-11-27 NOTE — Progress Notes (Signed)
   [facility] Department STI clinic/screening visit  Subjective:  Paul Bridges is a 19 y.o. male being seen today for an STI screening visit. The patient reports they do not have symptoms.    Patient has the following medical conditions:   Patient Active Problem List   Diagnosis Date Noted  . Testicular mass 10/29/2018  . Screening for STD (sexually transmitted disease) 10/29/2018     Chief Complaint  Patient presents with  . SEXUALLY TRANSMITTED DISEASE    screening    HPI  Patient reports that he does not have any symptoms but would like a screening today.  Denies chronic conditions, surgeries and regular medicines.  Reports last HIV test was 1 month ago.  Last void prior to sample collection for Gram stain was 1 hr ago.   See flowsheet for further details and programmatic requirements.    The following portions of the patient's history were reviewed and updated as appropriate: allergies, current medications, past medical history, past social history, past surgical history and problem list.  Objective:  There were no vitals filed for this visit.  Physical Exam Constitutional:      General: He is not in acute distress. HENT:     Head: Normocephalic and atraumatic.     Comments: No nits, lice, or hair loss. No cervical, supraclavicular or axillary adenopathy.    Mouth/Throat:     Mouth: Mucous membranes are moist.     Pharynx: Oropharynx is clear. No oropharyngeal exudate or posterior oropharyngeal erythema.  Pulmonary:     Effort: Pulmonary effort is normal.  Abdominal:     Palpations: Abdomen is soft. There is no mass.     Tenderness: There is no abdominal tenderness. There is no guarding or rebound.  Genitourinary:    Penis: Normal.      Testes: Normal.     Comments: Pubic area without nits, lice, edema, erythema, lesions and inguinal adenopathy. Penis circumcised, without rash, lesions and discharge from meatus. Musculoskeletal:      Cervical back: Neck supple. No tenderness.  Skin:    General: Skin is warm and dry.     Findings: No bruising, erythema, lesion or rash.  Neurological:     Mental Status: He is alert and oriented to person, place, and time.  Psychiatric:        Mood and Affect: Mood normal.        Thought Content: Thought content normal.        Judgment: Judgment normal.       Assessment and Plan:  Paul Bridges is a 19 y.o. male presenting to the [facility] Department for STI screening  1. Screening for STD (sexually transmitted disease) Patient into clinic without symptoms. Patient declines blood work today. Rec condoms with all sex. Await test results.  Counseled that RN will call if needs to RTC for treatment once results are back. - Gram stain - Gonococcus culture     No follow-ups on file.  No future appointments.  Matt Holmes, PA

## 2019-11-29 LAB — GRAM STAIN

## 2019-12-01 LAB — GONOCOCCUS CULTURE

## 2020-01-07 ENCOUNTER — Ambulatory Visit: Payer: Self-pay

## 2020-01-12 ENCOUNTER — Ambulatory Visit: Payer: Self-pay | Admitting: Physician Assistant

## 2020-01-12 ENCOUNTER — Other Ambulatory Visit: Payer: Self-pay

## 2020-01-12 DIAGNOSIS — Z113 Encounter for screening for infections with a predominantly sexual mode of transmission: Secondary | ICD-10-CM

## 2020-01-12 LAB — GRAM STAIN

## 2020-01-12 NOTE — Progress Notes (Signed)
Gram Stain results reviewed. Per standing orders no treatment indicated. Jolanta Cabeza, RN ° °

## 2020-01-13 ENCOUNTER — Encounter: Payer: Self-pay | Admitting: Physician Assistant

## 2020-01-13 NOTE — Progress Notes (Signed)
   [facility] Department STI clinic/screening visit  Subjective:  Paul Bridges is a 19 y.o. male being seen today for an STI screening visit. The patient reports they do not have symptoms.    Patient has the following medical conditions:   Patient Active Problem List   Diagnosis Date Noted  . Testicular mass 10/29/2018  . Screening for STD (sexually transmitted disease) 10/29/2018     Chief Complaint  Patient presents with  . SEXUALLY TRANSMITTED DISEASE    acreening    HPI  Patient reports that he has no symptoms but would like a screening.  Denies chronic conditions and surgeries.  Reports last HIV test was 3-4 months ago and last void was over 2 hr ago prior to sample collection for Gram stain.   See flowsheet for further details and programmatic requirements.    The following portions of the patient's history were reviewed and updated as appropriate: allergies, current medications, past medical history, past social history, past surgical history and problem list.  Objective:  There were no vitals filed for this visit.  Physical Exam Constitutional:      General: He is not in acute distress.    Appearance: Normal appearance.  HENT:     Head: Normocephalic and atraumatic.     Comments: No nits, lice or hair loss. No cervical, supraclavicular or axillary adenopathy.    Mouth/Throat:     Mouth: Mucous membranes are moist.     Pharynx: Oropharynx is clear. No oropharyngeal exudate or posterior oropharyngeal erythema.  Eyes:     Conjunctiva/sclera: Conjunctivae normal.  Pulmonary:     Effort: Pulmonary effort is normal.  Abdominal:     Palpations: Abdomen is soft. There is no mass.     Tenderness: There is no abdominal tenderness. There is no guarding or rebound.  Genitourinary:    Penis: Normal.      Testes: Normal.     Comments: Pubic area without nits, lice, edema, erythema, lesions and inguinal adenopathy. Penis circumcised, without rash,  lesions and discharge from meatus. Musculoskeletal:     Cervical back: Neck supple. No tenderness.  Skin:    General: Skin is warm and dry.     Findings: No bruising, erythema, lesion or rash.  Neurological:     Mental Status: He is alert and oriented to person, place, and time.  Psychiatric:        Mood and Affect: Mood normal.        Behavior: Behavior normal.        Thought Content: Thought content normal.        Judgment: Judgment normal.       Assessment and Plan:  Paul Llewyn Heap is a 19 y.o. male presenting to the [facility] Department for STI screening  1. Screening for STD (sexually transmitted disease) Patient into clinic with symptoms. Rec condoms with all sex. Await test results.  Counseled that RN will call if needs to RTC for treatment once results are back. - Gram stain - Gonococcus culture - HIV [place] [facility] - Syphilis Serology, [place] [facility]     No follow-ups on file.  No future appointments.  Matt Holmes, PA

## 2020-01-17 LAB — GONOCOCCUS CULTURE

## 2020-01-28 ENCOUNTER — Ambulatory Visit: Payer: Self-pay | Admitting: Physician Assistant

## 2020-01-28 ENCOUNTER — Other Ambulatory Visit: Payer: Self-pay

## 2020-01-28 DIAGNOSIS — Z113 Encounter for screening for infections with a predominantly sexual mode of transmission: Secondary | ICD-10-CM

## 2020-01-28 LAB — GRAM STAIN

## 2020-01-28 NOTE — Progress Notes (Signed)
Gram stain reviewed and is negative today, so no treatment needed for gram stain per standing order. Provider orders completed. 

## 2020-01-29 ENCOUNTER — Encounter: Payer: Self-pay | Admitting: Physician Assistant

## 2020-01-29 NOTE — Progress Notes (Signed)
   [facility] Department STI clinic/screening visit  Subjective:  Paul Bridges is a 19 y.o. male being seen today for an STI screening visit. The patient reports they do not have symptoms.    Patient has the following medical conditions:   Patient Active Problem List   Diagnosis Date Noted  . Testicular mass 10/29/2018  . Screening for STD (sexually transmitted disease) 10/29/2018     Chief Complaint  Patient presents with  . SEXUALLY TRANSMITTED DISEASE    screening    HPI  Patient reports that he is not having any symptoms but has had a new partner and would like a screening.  Denies changes to his history since last visit on 01/12/2020.  Last HIV test was less than 3 months ago.   See flowsheet for further details and programmatic requirements.    The following portions of the patient's history were reviewed and updated as appropriate: allergies, current medications, past medical history, past social history, past surgical history and problem list.  Objective:  There were no vitals filed for this visit.  Physical Exam Constitutional:      General: He is not in acute distress.    Appearance: Normal appearance.  HENT:     Head: Normocephalic and atraumatic.     Comments: No nits,lice, or hair loss. No cervical, supraclavicular or axillary adenopathy.    Mouth/Throat:     Mouth: Mucous membranes are moist.     Pharynx: Oropharynx is clear. No oropharyngeal exudate or posterior oropharyngeal erythema.  Eyes:     Conjunctiva/sclera: Conjunctivae normal.  Pulmonary:     Effort: Pulmonary effort is normal.  Abdominal:     Palpations: Abdomen is soft. There is no mass.     Tenderness: There is no abdominal tenderness. There is no guarding or rebound.  Genitourinary:    Penis: Normal.      Testes: Normal.     Comments: Pubic area without nits, lice, hair loss, edema, erythema, lesions and inguinal adenopathy. Penis circumcised, without rash,  lesions and discharge at meatus. Musculoskeletal:     Cervical back: Neck supple. No tenderness.  Skin:    General: Skin is warm and dry.     Findings: No bruising, erythema, lesion or rash.  Neurological:     Mental Status: He is alert and oriented to person, place, and time.  Psychiatric:        Mood and Affect: Mood normal.        Behavior: Behavior normal.        Thought Content: Thought content normal.        Judgment: Judgment normal.       Assessment and Plan:  Paul Bridges is a 19 y.o. male presenting to the [facility] Department for STI screening  1. Screening for STD (sexually transmitted disease) Patient into clinic without symptoms. Patient declines blood work today. Rec condoms with all sex. Await test results.  Counseled that RN will call if needs to RTC for treatment once results are back. - Gram stain - Gonococcus culture - Gonococcus culture     Return for PRN.  No future appointments.  Matt Holmes, PA

## 2020-02-01 LAB — GONOCOCCUS CULTURE

## 2020-02-23 DIAGNOSIS — Z20822 Contact with and (suspected) exposure to covid-19: Secondary | ICD-10-CM | POA: Diagnosis not present

## 2020-05-23 ENCOUNTER — Ambulatory Visit: Admit: 2020-05-23 | Disposition: A | Discharge status: Home / Self Care | Payer: Self-pay

## 2020-05-23 ENCOUNTER — Ambulatory Visit
Admission: EM | Admit: 2020-05-23 | Discharge: 2020-05-23 | Disposition: A | Discharge status: Home / Self Care | Payer: 59 | Attending: Family Medicine | Admitting: Family Medicine

## 2020-05-23 ENCOUNTER — Other Ambulatory Visit: Payer: Self-pay

## 2020-05-23 DIAGNOSIS — J029 Acute pharyngitis, unspecified: Secondary | ICD-10-CM

## 2020-05-23 DIAGNOSIS — J351 Hypertrophy of tonsils: Secondary | ICD-10-CM | POA: Diagnosis not present

## 2020-05-23 LAB — POCT RAPID STREP A (OFFICE): Rapid Strep A Screen: NEGATIVE

## 2020-05-23 MED ORDER — DEXAMETHASONE SODIUM PHOSPHATE 10 MG/ML IJ SOLN
10.0000 mg | Freq: Once | INTRAMUSCULAR | Status: AC
  Administered 2020-05-23: 10 mg

## 2020-05-23 NOTE — Discharge Instructions (Addendum)
Your strep test was negative. You can take ibuprofen and Tylenol as needed. Recommend follow-up with ear nose and throat specialist for any continued issues

## 2020-05-23 NOTE — ED Triage Notes (Signed)
Pt reports swollen tonsils starting on L four days ago now bilateral.  States he has had swollen tonsils in past that often occurs after eating seafood but he is unsure what he ate this time.  Denies sore throat but reports tonsil pain.

## 2020-05-24 NOTE — ED Provider Notes (Signed)
Renaldo Fiddler    CSN: [id number] Arrival date & time: 05/23/20  1445      History   Chief Complaint Chief Complaint  Patient presents with  . Sore Throat    (Tonsils)     HPI Eli'Ziah Obie Kallenbach is a 20 y.o. male.   Patient is a 20 year old male who presents today for chronic tonsillitis.  Started swelling approximate 4 days ago morning left now bilateral.  No significant pain.  No trouble swallowing or breathing.  No fever, chills.     Past Medical History:  Diagnosis Date  . Chronic tonsillitis   . H/O testicular mass     Patient Active Problem List   Diagnosis Date Noted  . Testicular mass 10/29/2018  . Screening for STD (sexually transmitted disease) 10/29/2018    History reviewed. No pertinent surgical history.     Home Medications    Prior to Admission medications   Medication Sig Start Date End Date Taking? Authorizing Provider  Ascorbic Acid (VITAMIN C) 100 MG tablet Take 100 mg by mouth daily.    [provider]  cholecalciferol (VITAMIN D3) 25 MCG (1000 UNIT) tablet Take 1,000 Units by mouth daily.    [provider]  vitamin B-12 (CYANOCOBALAMIN) 1000 MCG tablet Take 1,000 mcg by mouth daily.    [provider]    Family History Family History  Problem Relation Age of Onset  . Diabetes Mother   . Hyperlipidemia Mother   . Hypertension Mother     Social History Social History   Tobacco Use  . Smoking status: Never Smoker  . Smokeless tobacco: Never Used  Vaping Use  . Vaping Use: Never used  Substance Use Topics  . Alcohol use: Yes    Alcohol/week: 1.0 standard drink    Types: 1 Glasses of wine per week  . Drug use: Not Currently    Types: Marijuana    Comment: once every couple months     Allergies   Patient has no known allergies.   Review of Systems Review of Systems   Physical Exam Triage Vital Signs ED Triage Vitals  Enc Vitals Group     BP 05/23/20 1512 (!) 143/79     Pulse  Rate 05/23/20 1512 (!) 46     Resp 05/23/20 1512 18     Temp 05/23/20 1512 98.8 F (37.1 C)     Temp Source 05/23/20 1512 Oral     SpO2 05/23/20 1512 98 %     Weight --      Height --      Head Circumference --      Peak Flow --      Pain Score 05/23/20 1519 10     Pain Loc --      Pain Edu? --      Excl. in GC? --    No data found.  Updated Vital Signs BP (!) 143/79 (BP Location: Left Arm)   Pulse (!) 46   Temp 98.8 F (37.1 C) (Oral)   Resp 18   SpO2 98%   Visual Acuity Right Eye Distance:   Left Eye Distance:   Bilateral Distance:    Right Eye Near:   Left Eye Near:    Bilateral Near:     Physical Exam Vitals and nursing note reviewed.  Constitutional:      General: He is not in acute distress.    Appearance: Normal appearance. He is not ill-appearing, toxic-appearing or diaphoretic.  HENT:  Head: Normocephalic and atraumatic.     Mouth/Throat:     Pharynx: Uvula midline. Posterior oropharyngeal erythema present.     Tonsils: No tonsillar exudate or tonsillar abscesses. 2+ on the right. 2+ on the left.  Eyes:     Conjunctiva/sclera: Conjunctivae normal.  Pulmonary:     Effort: Pulmonary effort is normal.  Musculoskeletal:        General: Normal range of motion.     Cervical back: Normal range of motion.  Skin:    General: Skin is warm and dry.  Neurological:     Mental Status: He is alert.  Psychiatric:        Mood and Affect: Mood normal.      UC Treatments / Results  Labs (all labs ordered are listed, but only abnormal results are displayed) Labs Reviewed  CULTURE, GROUP A STREP [facility])  POCT RAPID STREP A (OFFICE)    EKG   Radiology No results found.  Procedures Procedures (including critical care time)  Medications Ordered in UC Medications  dexamethasone (DECADRON) injection 10 mg (10 mg Other Given 05/23/20 1600)    Initial Impression / Assessment and Plan / UC Course  I have reviewed the triage vital signs and the nursing  notes.  Pertinent labs & imaging results that were available during my care of the patient were reviewed by me and considered in my medical decision making (see chart for details).     Tonsillar swelling Rapid strep test negative today. Decadron given here for tonsillar swelling and inflammation. Recommend Tylenol and ibuprofen as needed Follow up with ENT Final Clinical Impressions(s) / UC Diagnoses   Final diagnoses:  Swelling of tonsil     Discharge Instructions     Your strep test was negative. You can take ibuprofen and Tylenol as needed. Recommend follow-up with ear nose and throat specialist for any continued issues    ED Prescriptions    None     PDMP not reviewed this encounter.   Janace Aris, NP 05/24/20 [phone]

## 2020-05-25 ENCOUNTER — Telehealth: Payer: Self-pay | Admitting: Family Medicine

## 2020-05-25 LAB — CULTURE, GROUP A STREP (THRC)

## 2020-05-25 MED ORDER — AMOXICILLIN-POT CLAVULANATE 875-125 MG PO TABS: 1.0000 | ORAL_TABLET | Freq: Two times a day (BID) | ORAL | 0 refills | Status: DC

## 2020-05-25 NOTE — Telephone Encounter (Signed)
Strep culture positive strep A. Pt not feeling better. Sending in abx

## 2020-07-21 ENCOUNTER — Ambulatory Visit: Payer: Self-pay | Admitting: Physician Assistant

## 2020-07-21 ENCOUNTER — Encounter: Payer: Self-pay | Admitting: Physician Assistant

## 2020-07-21 ENCOUNTER — Other Ambulatory Visit: Payer: Self-pay

## 2020-07-21 DIAGNOSIS — Z113 Encounter for screening for infections with a predominantly sexual mode of transmission: Secondary | ICD-10-CM

## 2020-07-21 LAB — GRAM STAIN

## 2020-07-21 NOTE — Progress Notes (Signed)
Gram stain reviewed; no tx per SO Richmond Campbell, RN

## 2020-07-21 NOTE — Progress Notes (Signed)
   [facility] Department STI clinic/screening visit  Subjective:  Paul Bridges is a 20 y.o. male being seen today for an STI screening visit. The patient reports they do not have symptoms.    Patient has the following medical conditions:   Patient Active Problem List   Diagnosis Date Noted  . Testicular mass 10/29/2018  . Screening for STD (sexually transmitted disease) 10/29/2018     Chief Complaint  Patient presents with  . STD screen    HPI  Patient reports that he is not having any symptoms but would like a screening today.  Denies chronic conditions, surgeries and regular medicines.  Reports last HIV test was last year and last void prior to sample collection for Gram stain was about 1 hr ago.   See flowsheet for further details and programmatic requirements.    The following portions of the patient's history were reviewed and updated as appropriate: allergies, current medications, past medical history, past social history, past surgical history and problem list.  Objective:  There were no vitals filed for this visit.  Physical Exam Constitutional:      General: He is not in acute distress.    Appearance: Normal appearance.  HENT:     Head: Normocephalic and atraumatic.     Comments: No nits,lice, or hair loss. No cervical, supraclavicular or axillary adenopathy.    Mouth/Throat:     Mouth: Mucous membranes are moist.     Pharynx: Oropharynx is clear. No oropharyngeal exudate or posterior oropharyngeal erythema.  Eyes:     Conjunctiva/sclera: Conjunctivae normal.  Pulmonary:     Effort: Pulmonary effort is normal.  Abdominal:     Palpations: Abdomen is soft. There is no mass.     Tenderness: There is no abdominal tenderness. There is no guarding or rebound.  Genitourinary:    Penis: Normal.      Testes: Normal.     Comments: Pubic area without nits, lice, hair loss, edema, erythema, lesions and inguinal adenopathy. Penis circumcised  without rash, lesions and discharge at meatus. Musculoskeletal:     Cervical back: Neck supple. No tenderness.  Skin:    General: Skin is warm and dry.     Findings: No bruising, erythema, lesion or rash.  Neurological:     Mental Status: He is alert and oriented to person, place, and time.  Psychiatric:        Mood and Affect: Mood normal.        Behavior: Behavior normal.        Thought Content: Thought content normal.        Judgment: Judgment normal.       Assessment and Plan:  Paul Bridges is a 20 y.o. male presenting to the [facility] Department for STI screening  1. Screening for STD (sexually transmitted disease) Patient into clinic without symptoms. Rec condoms with all sex. Await test results.  Counseled that RN will call if needs to RTC for treatment once results are back. - Gram stain - Gonococcus culture - HIV [place] [facility] - Syphilis Serology, [place] [facility] - Gonococcus culture     No follow-ups on file.  No future appointments.  Matt Holmes, PA

## 2020-07-27 ENCOUNTER — Encounter: Payer: Self-pay | Admitting: Student

## 2020-07-27 LAB — GONOCOCCUS CULTURE

## 2020-07-27 LAB — HM HIV SCREENING LAB: HM HIV Screening: NEGATIVE

## 2020-12-02 ENCOUNTER — Ambulatory Visit (HOSPITAL_COMMUNITY): Admit: 2020-12-02 | Discharge status: Against Medical Advice | Payer: Self-pay

## 2020-12-02 ENCOUNTER — Emergency Department (INDEPENDENT_AMBULATORY_CARE_PROVIDER_SITE_OTHER): Payer: Self-pay

## 2020-12-02 ENCOUNTER — Emergency Department
Admission: EM | Admit: 2020-12-02 | Discharge: 2020-12-02 | Disposition: A | Discharge status: Home / Self Care | Payer: Self-pay | Source: Home / Self Care

## 2020-12-02 ENCOUNTER — Other Ambulatory Visit: Payer: Self-pay

## 2020-12-02 ENCOUNTER — Ambulatory Visit
Admission: EM | Admit: 2020-12-02 | Discharge: 2020-12-02 | Disposition: A | Discharge status: Home / Self Care | Payer: 59 | Attending: Family Medicine | Admitting: Family Medicine

## 2020-12-02 DIAGNOSIS — S161XXA Strain of muscle, fascia and tendon at neck level, initial encounter: Secondary | ICD-10-CM

## 2020-12-02 DIAGNOSIS — M25519 Pain in unspecified shoulder: Secondary | ICD-10-CM

## 2020-12-02 DIAGNOSIS — M542 Cervicalgia: Secondary | ICD-10-CM

## 2020-12-02 DIAGNOSIS — M62838 Other muscle spasm: Secondary | ICD-10-CM

## 2020-12-02 MED ORDER — PREDNISONE 20 MG PO TABS: ORAL_TABLET | ORAL | 0 refills | Status: DC

## 2020-12-02 MED ORDER — BACLOFEN 10 MG PO TABS: 10.0000 mg | ORAL_TABLET | Freq: Three times a day (TID) | ORAL | 0 refills | Status: DC

## 2020-12-02 NOTE — Discharge Instructions (Addendum)
Advised patient to take medication as directed with food to completion.  Advised/encouraged patient to avoid offending activities involving affected area of posterior neck for the next 7 to 10 days.
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
## 2020-12-05 ENCOUNTER — Ambulatory Visit: Payer: Self-pay | Admitting: Physician Assistant

## 2020-12-05 ENCOUNTER — Other Ambulatory Visit: Payer: Self-pay

## 2020-12-05 DIAGNOSIS — Z113 Encounter for screening for infections with a predominantly sexual mode of transmission: Secondary | ICD-10-CM

## 2020-12-05 LAB — GRAM STAIN

## 2020-12-06 ENCOUNTER — Encounter: Payer: Self-pay | Admitting: Physician Assistant

## 2020-12-06 NOTE — Progress Notes (Signed)
   [facility] Department STI clinic/screening visit  Subjective:  Paul Bridges is a 20 y.o. male being seen today for an STI screening visit. The patient reports they do not have symptoms.    Patient has the following medical conditions:   Patient Active Problem List   Diagnosis Date Noted   Testicular mass 10/29/2018   Screening for STD (sexually transmitted disease) 10/29/2018     Chief Complaint  Patient presents with   SEXUALLY TRANSMITTED DISEASE    screening    HPI  Patient reports that he is not having any symptoms but would like a screening today.  Denies chronic conditions, surgeries and regular medicines.  Reports last HIV test was in March of 2022 and last void prior to sample collection for Gram stain was about 2 hr ago.    See flowsheet for further details and programmatic requirements.    The following portions of the patient's history were reviewed and updated as appropriate: allergies, current medications, past medical history, past social history, past surgical history and problem list.  Objective:  There were no vitals filed for this visit.  Physical Exam Constitutional:      General: He is not in acute distress.    Appearance: Normal appearance.  HENT:     Head: Normocephalic and atraumatic.     Comments: No nits,lice, or hair loss. No cervical, supraclavicular or axillary adenopathy.     Mouth/Throat:     Mouth: Mucous membranes are moist.     Pharynx: Oropharynx is clear. No oropharyngeal exudate or posterior oropharyngeal erythema.  Eyes:     Conjunctiva/sclera: Conjunctivae normal.  Pulmonary:     Effort: Pulmonary effort is normal.  Abdominal:     Palpations: Abdomen is soft. There is no mass.     Tenderness: There is no abdominal tenderness. There is no guarding or rebound.  Genitourinary:    Penis: Normal.      Testes: Normal.     Comments: Pubic area without nits, lice, hair loss, edema, erythema, lesions and  inguinal adenopathy. Penis circumcised without rash, lesions and discharge at meatus. Testicles descended bilaterally,nt, no masses or edema.  Musculoskeletal:     Cervical back: Neck supple. No tenderness.  Skin:    General: Skin is warm and dry.     Findings: No bruising, erythema, lesion or rash.  Neurological:     Mental Status: He is alert and oriented to person, place, and time.  Psychiatric:        Mood and Affect: Mood normal.        Behavior: Behavior normal.        Thought Content: Thought content normal.        Judgment: Judgment normal.      Assessment and Plan:  Paul Bridges is a 20 y.o. male presenting to the [facility] Department for STI screening  1. Screening for STD (sexually transmitted disease) Patient into clinic without symptoms. Reviewed with patient Gram stain results and that no treatment is indicated today. Rec condoms with all sex. Await test results.  Counseled that RN will call if needs to RTC for treatment once results are back.  - Gram stain - Gonococcus culture - HIV [place] [facility] - Syphilis Serology, [place] [facility] - Gonococcus culture     No follow-ups on file.  No future appointments.  Matt Holmes, PA

## 2020-12-09 LAB — GONOCOCCUS CULTURE

## 2021-01-05 ENCOUNTER — Encounter: Payer: Self-pay | Admitting: Physician Assistant

## 2021-01-05 ENCOUNTER — Other Ambulatory Visit: Payer: Self-pay

## 2021-01-05 ENCOUNTER — Ambulatory Visit: Payer: Self-pay | Admitting: Physician Assistant

## 2021-01-05 DIAGNOSIS — Z113 Encounter for screening for infections with a predominantly sexual mode of transmission: Secondary | ICD-10-CM

## 2021-01-05 NOTE — Progress Notes (Signed)
S:  Patient into clinic requesting testing for NGU.  States that a partner tested positive for Chlamydia or another bacteria and told him that her provider told her that he needs testing for NGU.  Patient reports that he has been taking Doxycycline 100 mg BID for the last 5 days from a supply that a family member had stopped taking. Denies any symptoms himself, just started taking the medicine. O:  WDWN male in NAD, A&O x 3, normal work of breathing. A/P:  1.  Patient is a contact to likely Chlamydia. 2.  Patient has been taking medicine for Chlamydia/NGU already. 3.  Counseled patient that he should take 2 more days of Doxycycline as he has been taking it to complete his treatment. 4.  Counseled that any tesing would not be accurate due to his currently taking an antibiotic. 5.  No sex for 7 days and until after partner completes treatment. 6.  RTC if does not have enough medicine, though he states that he has more than 2 days of medicine left at home. 7.  Condoms with all sex. 8.  RTC prn.

## 2021-02-28 ENCOUNTER — Ambulatory Visit: Payer: Self-pay | Admitting: Physician Assistant

## 2021-02-28 ENCOUNTER — Other Ambulatory Visit: Payer: Self-pay

## 2021-02-28 ENCOUNTER — Encounter: Payer: Self-pay | Admitting: Physician Assistant

## 2021-02-28 DIAGNOSIS — Z113 Encounter for screening for infections with a predominantly sexual mode of transmission: Secondary | ICD-10-CM

## 2021-02-28 LAB — GRAM STAIN

## 2021-02-28 NOTE — Progress Notes (Signed)
[facility] Department STI clinic/screening visit  Subjective:  Paul Bridges is a 20 y.o. male being seen today for an STI screening visit. The patient reports they do not have symptoms.    Patient has the following medical conditions:   Patient Active Problem List   Diagnosis Date Noted   Testicular mass 10/29/2018     Chief Complaint  Patient presents with   SEXUALLY TRANSMITTED DISEASE    screening    HPI  Patient reports that he is not having symptoms but is concerned that he urinates frequently.  States that he is drinking a lot of water and thinks that is why he is urinating a lot but wants to make sure.  Denies chronic conditions, surgeries and regular medicines.  Reports last HIV test was 3 months ago and last void prior to sample collection for Gram stain was over 2 hr ago.    Screening for MPX risk: Does the patient have an unexplained rash? No Is the patient MSM? No Does the patient endorse multiple sex partners or anonymous sex partners? No Did the patient have close or sexual contact with a person diagnosed with MPX? No Has the patient traveled outside the [place] where MPX is endemic? No Is there a high clinical suspicion for MPX-- evidenced by one of the following No  -Unlikely to be chickenpox  -Lymphadenopathy  -Rash that present in same phase of evolution on any given body part   See flowsheet for further details and programmatic requirements.    The following portions of the patient's history were reviewed and updated as appropriate: allergies, current medications, past medical history, past social history, past surgical history and problem list.  Objective:  There were no vitals filed for this visit.  Physical Exam Constitutional:      General: He is not in acute distress.    Appearance: Normal appearance.  HENT:     Head: Normocephalic and atraumatic.     Comments: No nits,lice, or hair loss. No cervical, supraclavicular or  axillary adenopathy.     Mouth/Throat:     Mouth: Mucous membranes are moist.     Pharynx: Oropharynx is clear. No oropharyngeal exudate or posterior oropharyngeal erythema.  Eyes:     Conjunctiva/sclera: Conjunctivae normal.  Pulmonary:     Effort: Pulmonary effort is normal.  Abdominal:     Palpations: Abdomen is soft. There is no mass.     Tenderness: There is no abdominal tenderness. There is no guarding or rebound.  Genitourinary:    Penis: Normal.      Testes: Normal.     Comments: Pubic area without nits, lice, hair loss, edema, erythema, lesions and inguinal adenopathy. Penis circumcised without rash, lesions and discharge at meatus. Testicles descended bilaterally,nt, no masses or edema.  Musculoskeletal:     Cervical back: Neck supple. No tenderness.  Skin:    General: Skin is warm and dry.     Findings: No bruising, erythema, lesion or rash.  Neurological:     Mental Status: He is alert and oriented to person, place, and time.  Psychiatric:        Mood and Affect: Mood normal.        Behavior: Behavior normal.        Thought Content: Thought content normal.        Judgment: Judgment normal.      Assessment and Plan:  Paul Bridges is a 20 y.o. male presenting to the [facility]  Department for STI screening  1. Screening for STD (sexually transmitted disease) Patient into clinic without symptoms. Reviewed with patient Gram stain results and that no treatment is indicated today. Rec condoms with all sex. Await test results.  Counseled that RN will call if needs to RTC for treatment once results are back.  - Gram stain - Gonococcus culture - HIV [place] [facility] - Syphilis Serology, [place] [facility]     No follow-ups on file.  No future appointments.  Matt Holmes, PA

## 2021-03-04 LAB — GONOCOCCUS CULTURE

## 2021-05-25 ENCOUNTER — Ambulatory Visit (HOSPITAL_COMMUNITY)
Admission: EM | Admit: 2021-05-25 | Discharge: 2021-05-25 | Disposition: A | Discharge status: Home / Self Care | Payer: Self-pay | Attending: Family Medicine | Admitting: Family Medicine

## 2021-05-25 ENCOUNTER — Other Ambulatory Visit: Payer: Self-pay

## 2021-05-25 ENCOUNTER — Encounter (HOSPITAL_COMMUNITY): Payer: Self-pay

## 2021-05-25 DIAGNOSIS — R1032 Left lower quadrant pain: Secondary | ICD-10-CM

## 2021-05-25 NOTE — ED Triage Notes (Signed)
Pt presents for left side groin pain. He thinks he may have been lifting heavy boxes while on the job.

## 2021-05-25 NOTE — ED Provider Notes (Signed)
[facility]    CSN: [id number] Arrival date & time: 05/25/21  1340      History   Chief Complaint Chief Complaint  Patient presents with   Groin Pain    HPI Paul Bridges is a 21 y.o. male.    Groin Pain  Here for mild pain noted in the last 2 to 3 days in his left inguinal area and near his left testicle.  He has not noted any swelling or erythema.  No fever or chills.  No penile discharge nor any dysuria  Expresses concern about needing to be tested for STDs  Past Medical History:  Diagnosis Date   Chronic tonsillitis    H/O testicular mass     Patient Active Problem List   Diagnosis Date Noted   Testicular mass 10/29/2018    History reviewed. No pertinent surgical history.     Home Medications    Prior to Admission medications   Medication Sig Start Date End Date Taking? Authorizing Provider  Ascorbic Acid (VITAMIN C) 100 MG tablet Take 100 mg by mouth daily.    [provider]  baclofen (LIORESAL) 10 MG tablet Take 1 tablet (10 mg total) by mouth 3 (three) times daily. 12/02/20   [name], FNP  cholecalciferol (VITAMIN D3) 25 MCG (1000 UNIT) tablet Take 1,000 Units by mouth daily.    [provider]  predniSONE (DELTASONE) 20 MG tablet Take 3 tabs PO daily x 5 days. 12/02/20   [name], FNP    Family History Family History  Problem Relation Age of Onset   Diabetes Mother    Hyperlipidemia Mother    Hypertension Mother     Social History Social History   Tobacco Use   Smoking status: Never   Smokeless tobacco: Never  Vaping Use   Vaping Use: Never used  Substance Use Topics   Alcohol use: Yes    Alcohol/week: 1.0 standard drink    Types: 1 Glasses of wine per week   Drug use: Not Currently    Types: Marijuana    Comment: once every couple months     Allergies   Patient has no known allergies.   Review of Systems Review of Systems   Physical Exam Triage Vital Signs ED Triage Vitals   Enc Vitals Group     BP 05/25/21 1353 125/65     Pulse Rate 05/25/21 1353 (!) 49     Resp 05/25/21 1353 16     Temp 05/25/21 1353 98.2 F (36.8 C)     Temp Source 05/25/21 1353 Oral     SpO2 05/25/21 1353 100 %     Weight --      Height --      Head Circumference --      Peak Flow --      Pain Score 05/25/21 1354 8     Pain Loc --      Pain Edu? --      Excl. in GC? --    No data found.  Updated Vital Signs BP 125/65 (BP Location: Left Arm)    Pulse (!) 49    Temp 98.2 F (36.8 C) (Oral)    Resp 16    SpO2 100%   Visual Acuity Right Eye Distance:   Left Eye Distance:   Bilateral Distance:    Right Eye Near:   Left Eye Near:    Bilateral Near:     Physical Exam Vitals reviewed.  Constitutional:  General: He is not in acute distress.    Appearance: He is not toxic-appearing.  Genitourinary:    Comments: No edema or erythema of the area. On the left mons pubis, he notes the pain.  ?? Hernia palpable with cough. Not really tender today Neurological:     Mental Status: He is alert and oriented to person, place, and time.  Psychiatric:        Behavior: Behavior normal.     UC Treatments / Results  Labs (all labs ordered are listed, but only abnormal results are displayed) Labs Reviewed  CYTOLOGY, (ORAL, ANAL, URETHRAL) ANCILLARY ONLY    EKG   Radiology No results found.  Procedures Procedures (including critical care time)  Medications Ordered in UC Medications - No data to display  Initial Impression / Assessment and Plan / UC Course  I have reviewed the triage vital signs and the nursing notes.  Pertinent labs & imaging results that were available during my care of the patient were reviewed by me and considered in my medical decision making (see chart for details).     Will request assistance with finding a PCP (discussed poss need for [place] to aid in diagnosis, esp if symptoms persist or worsen) Final Clinical Impressions(s) / UC Diagnoses    Final diagnoses:  Left inguinal pain     Discharge Instructions      Our staff will call you if tests are positive.   Take tylenol as needed for the pain     ED Prescriptions   None    PDMP not reviewed this encounter.   Barrett Henle, MD 05/25/21 [phone]

## 2021-05-25 NOTE — Discharge Instructions (Addendum)
Our staff will call you if tests are positive.   Take tylenol as needed for the pain

## 2021-05-28 LAB — CYTOLOGY, (ORAL, ANAL, URETHRAL) ANCILLARY ONLY
Chlamydia: NEGATIVE
Comment: NEGATIVE
Comment: NEGATIVE
Comment: NORMAL
Neisseria Gonorrhea: NEGATIVE
Trichomonas: NEGATIVE

## 2021-10-02 ENCOUNTER — Ambulatory Visit (INDEPENDENT_AMBULATORY_CARE_PROVIDER_SITE_OTHER): Payer: BC Managed Care – PPO | Admitting: Pharmacist

## 2021-10-02 ENCOUNTER — Ambulatory Visit: Payer: Self-pay

## 2021-10-02 ENCOUNTER — Other Ambulatory Visit (HOSPITAL_COMMUNITY)
Admission: RE | Admit: 2021-10-02 | Discharge: 2021-10-02 | Disposition: A | Discharge status: Home / Self Care | Payer: BC Managed Care – PPO | Source: Ambulatory Visit | Attending: Infectious Disease | Admitting: Infectious Disease

## 2021-10-02 ENCOUNTER — Other Ambulatory Visit: Payer: Self-pay

## 2021-10-02 DIAGNOSIS — Z113 Encounter for screening for infections with a predominantly sexual mode of transmission: Secondary | ICD-10-CM | POA: Diagnosis not present

## 2021-10-02 NOTE — Progress Notes (Signed)
   10/02/2021  HPI: Paul Bridges is a 21 y.o. male who presents to the [facility] today for STI testing.  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   BACLOFEN (LIORESAL) 10 MG TABLET    Take 1 tablet (10 mg total) by mouth 3 (three) times daily.   CHOLECALCIFEROL (VITAMIN D3) 25 MCG (1000 UNIT) TABLET    Take 1,000 Units by mouth daily.   PREDNISONE (DELTASONE) 20 MG TABLET    Take 3 tabs PO daily x 5 days.  Modified Medications   No medications on file  Discontinued Medications   No medications on file    Allergies: No Known Allergies  Past Medical History: Past Medical History:  Diagnosis Date   Chronic tonsillitis    H/O testicular mass     Social History: Social History   Socioeconomic History   Marital status: Single    Spouse name: Not on file   Number of children: Not on file   Years of education: Not on file   Highest education level: Not on file  Occupational History   Not on file  Tobacco Use   Smoking status: Never   Smokeless tobacco: Never  Vaping Use   Vaping Use: Never used  Substance and Sexual Activity   Alcohol use: Yes    Alcohol/week: 1.0 standard drink    Types: 1 Glasses of wine per week   Drug use: Not Currently    Types: Marijuana    Comment: once every couple months   Sexual activity: Yes    Partners: Female    Birth control/protection: Condom  Other Topics Concern   Not on file  Social History Narrative   Single.   No children.   Works in Clinical biochemist.   Student at [employer]   Social Determinants of Health   Financial Resource Strain: Not on file  Food Insecurity: Not on file  Transportation Needs: Not on file  Physical Activity: Not on file  Stress: Not on file  Social Connections: Not on file      Assessment: Paul presents today to assure he does not have STIs prior to other sexual encounters. He is aware of his high risk behavior and normally gets tested at the health department since he gets same day results. He reports multiple male partners and intermittent condom use. He will be STI tested for HIV Ab, RPR, and STI testing (oral, urine). He was counseled on PrEP initiation since he has come in multiple times for STI screenings, however, he declines and expresses interest in continuing to screen regularly. He was also offered condoms, but politely declined since he only wants latex free condoms for his partners (no latex allergy for himself).  Plan: - STI screening: HIV Ab, RPR, urine/pharyngeal GC/CT swabs for cytology today - F/u results to see if treatment needed  March Rummage, PharmD PGY1 Pharmacy Resident [facility] 10/02/2021, 3:00 PM

## 2021-10-03 LAB — URINE CYTOLOGY ANCILLARY ONLY
Chlamydia: NEGATIVE
Comment: NEGATIVE
Comment: NORMAL
Neisseria Gonorrhea: NEGATIVE

## 2021-10-03 LAB — CYTOLOGY, (ORAL, ANAL, URETHRAL) ANCILLARY ONLY
Chlamydia: NEGATIVE
Comment: NEGATIVE
Comment: NORMAL
Neisseria Gonorrhea: NEGATIVE

## 2021-10-03 LAB — RPR: RPR Ser Ql: NONREACTIVE

## 2021-10-03 LAB — HIV ANTIBODY (ROUTINE TESTING W REFLEX): HIV 1&2 Ab, 4th Generation: NONREACTIVE
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
## 2021-10-11 ENCOUNTER — Telehealth: Payer: Self-pay

## 2021-10-11 NOTE — Telephone Encounter (Signed)
Patient called office today requesting appointment for STI testing. States that he had sexual encounter with new partner and wanted to be retested. Is scheduled to come in Monday to see pharmacy team.  Would only like to do urine test and swabs. Does not want to do blood test. Patient understands he must refrain from ALL sexual activity until he is seen and test results are back. Juanita Laster, RMA

## 2021-10-15 ENCOUNTER — Other Ambulatory Visit: Payer: Self-pay

## 2021-10-15 ENCOUNTER — Ambulatory Visit (INDEPENDENT_AMBULATORY_CARE_PROVIDER_SITE_OTHER): Payer: BC Managed Care – PPO | Admitting: Pharmacist

## 2021-10-15 ENCOUNTER — Other Ambulatory Visit (HOSPITAL_COMMUNITY)
Admission: RE | Admit: 2021-10-15 | Discharge: 2021-10-15 | Disposition: A | Discharge status: Home / Self Care | Payer: BC Managed Care – PPO | Source: Ambulatory Visit | Attending: Infectious Disease | Admitting: Infectious Disease

## 2021-10-15 DIAGNOSIS — Z202 Contact with and (suspected) exposure to infections with a predominantly sexual mode of transmission: Secondary | ICD-10-CM | POA: Diagnosis not present

## 2021-10-15 DIAGNOSIS — Z113 Encounter for screening for infections with a predominantly sexual mode of transmission: Secondary | ICD-10-CM | POA: Diagnosis not present

## 2021-10-15 MED ORDER — DOXYCYCLINE HYCLATE 100 MG PO TABS: 100.0000 mg | ORAL_TABLET | Freq: Two times a day (BID) | ORAL | 0 refills | Status: DC

## 2021-10-15 NOTE — Progress Notes (Signed)
   10/15/2021  HPI: Paul Bridges is a 21 y.o. male who presents to the [facility] today for STI testing.  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  Previous Medications   ASCORBIC ACID (VITAMIN C) 100 MG TABLET    Take 100 mg by mouth daily.   BACLOFEN (LIORESAL) 10 MG TABLET    Take 1 tablet (10 mg total) by mouth 3 (three) times daily.   CHOLECALCIFEROL (VITAMIN D3) 25 MCG (1000 UNIT) TABLET    Take 1,000 Units by mouth daily.   PREDNISONE (DELTASONE) 20 MG TABLET    Take 3 tabs PO daily x 5 days.  Modified Medications   No medications on file  Discontinued Medications   No medications on file    Allergies: No Known Allergies  Past Medical History: Past Medical History:  Diagnosis Date   Chronic tonsillitis    H/O testicular mass     Social History: Social History   Socioeconomic History   Marital status: Single    Spouse name: Not on file   Number of children: Not on file   Years of education: Not on file   Highest education level: Not on file  Occupational History   Not on file  Tobacco Use   Smoking status: Never   Smokeless tobacco: Never  Vaping Use   Vaping Use: Never used  Substance and Sexual Activity   Alcohol use: Yes    Alcohol/week: 1.0 standard drink of alcohol    Types: 1 Glasses of wine per week   Drug use: Not Currently    Types: Marijuana    Comment: once every couple months   Sexual activity: Yes    Partners: Female    Birth control/protection: Condom  Other Topics Concern   Not on file  Social History Narrative   Single.   No children.   Works in Clinical biochemist.   Student at [employer]   Social Determinants of Health   Financial Resource Strain: Not on file  Food Insecurity: Not on file  Transportation Needs: Not on file   Physical Activity: Not on file  Stress: Not on file  Social Connections: Not on file   Assessment: Paul is here today for STI testing. Last tested on 5/30 but had one new male partner since then with no condom use. He is not having any signs or symptoms of any STIs but thinks that he may have been exposed to chlamydia. He is also worried about possible eczema on his penis and showed me a picture. It does look like dry skin and has since resolved after using Aquafor. Marcos Eke, NP, also reviewed and thinks it was dry skin as well. He is refusing a blood draw today and will only do a urine cytology. He is also refusing gonorrhea treatment. I advised him that if it results positive he will have to come back here for an injection. He agrees with the plan.   Plan: - Urine GC/CT cytology today - Doxycycline 100 mg PO BID x 7 days - F/u results to see if treatment is needed  Dekayla Prestridge L. Kischa Altice, PharmD, BCIDP, AAHIVP, CPP Clinical Pharmacist Practitioner Infectious Diseases Clinical Pharmacist [facility] 10/15/2021, 3:05 PM

## 2021-10-16 LAB — URINE CYTOLOGY ANCILLARY ONLY
Chlamydia: NEGATIVE
Comment: NEGATIVE
Comment: NORMAL
Neisseria Gonorrhea: NEGATIVE

## 2021-11-14 ENCOUNTER — Other Ambulatory Visit (HOSPITAL_COMMUNITY)
Admission: RE | Admit: 2021-11-14 | Discharge: 2021-11-14 | Disposition: A | Discharge status: Home / Self Care | Payer: BC Managed Care – PPO | Source: Ambulatory Visit | Attending: Infectious Disease | Admitting: Infectious Disease

## 2021-11-14 ENCOUNTER — Other Ambulatory Visit: Payer: Self-pay

## 2021-11-14 ENCOUNTER — Ambulatory Visit (INDEPENDENT_AMBULATORY_CARE_PROVIDER_SITE_OTHER): Payer: BC Managed Care – PPO | Admitting: Pharmacist

## 2021-11-14 DIAGNOSIS — Z113 Encounter for screening for infections with a predominantly sexual mode of transmission: Secondary | ICD-10-CM | POA: Diagnosis not present

## 2021-11-14 NOTE — Progress Notes (Signed)
Date:  11/14/2021   HPI: Paul Bridges is a 21 y.o. male who presents to the [facility] for HIV PrEP follow-up.  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   BACLOFEN (LIORESAL) 10 MG TABLET    Take 1 tablet (10 mg total) by mouth 3 (three) times daily.   CHOLECALCIFEROL (VITAMIN D3) 25 MCG (1000 UNIT) TABLET    Take 1,000 Units by mouth daily.   DOXYCYCLINE (VIBRA-TABS) 100 MG TABLET    Take 1 tablet (100 mg total) by mouth 2 (two) times daily.   PREDNISONE (DELTASONE) 20 MG TABLET    Take 3 tabs PO daily x 5 days.  Modified Medications   No medications on file  Discontinued Medications   No medications on file    Allergies: No Known Allergies  Past Medical History: Past Medical History:  Diagnosis Date   Chronic tonsillitis    H/O testicular mass     Social History: Social History   Socioeconomic History   Marital status: Single    Spouse name: Not on file   Number of children: Not on file   Years of education: Not on file   Highest education level: Not on file  Occupational History   Not on file  Tobacco Use   Smoking status: Never   Smokeless tobacco: Never  Vaping Use   Vaping Use: Never used  Substance and Sexual Activity   Alcohol use: Yes    Alcohol/week: 1.0 standard drink of alcohol    Types: 1 Glasses of wine per week   Drug use: Not Currently    Types: Marijuana    Comment: once every couple months   Sexual activity: Yes    Partners: Female    Birth control/protection: Condom  Other Topics Concern   Not on file  Social History Narrative   Single.   No children.   Works in .   Student at 10/31/2018   Social Determinants of Health   Financial Resource Strain:  Not on file  Food Insecurity: Not on file  Transportation Needs: Not on file  Physical Activity: Not on file  Stress: Not on file  Social Connections: Not on file       10/02/2021    2:51 PM  [facility] HIV PREP FLOWSHEET RESULTS  Insurance Status Insured  How did you hear? Urgent care  Gender at birth Male  Gender identity cis-Male  Risk for HIV Condomless vaginal or anal intercourse  Sex Partners Women only  # sex partners past 3-6 mos 5  Sex activity preferences Insertive;Oral  Condom use Yes  % condom use 60  Treated for STI? No  HIV symptoms? None    Labs:  SCr: No results found for: "CREATININE" HIV Lab Results  Component Value Date   HIV NON-REACTIVE 10/02/2021   HIV NON-REACTIVE 10/29/2018   Hepatitis B No results found for: "HEPBSAB", "HEPBSAG", "HEPBCAB" Hepatitis C No results found for: "HEPCAB", "HCVRNAPCRQN" Hepatitis A No results found for: "HAV" RPR and STI Lab Results  Component Value Date   LABRPR NON-REACTIVE 10/02/2021   LABRPR NON-REACTIVE 10/29/2018    STI Results GC CT  10/15/2021  2:51  PM Negative  Negative   10/02/2021  3:01 PM Negative    Negative  Negative    Negative   05/25/2021  2:14 PM Negative  Negative     Assessment: Paul Bridges presents to clinic today for routine STI testing. Denies any STI symptoms or concerns. Sexually active with 4 consistent partners currently; no new partners since last seen in June. Requested full STI screening but refused any blood draws due to fear of needles. Emphasized need to screen for syphilis and HIV, but patient adamantly declined blood work. Will check urine and oral cytologies only today. Screened patient for acute HIV symptoms such as fatigue, muscle aches, rash, sore throat, lymphadenopathy, headache, night sweats, nausea/vomiting/diarrhea, and fever as patient refused HIV antibody blood test.   Counseled patient on PrEP again during this visit; he adamantly refuses need for PrEP. Does not  acknowledge risk for HIV and was physically/verbally upset over me mentioning that PrEP is recommended in high-risk sexually active patients. Reminded patient that any time he is seen at [facility] for [facility]I testing, all providers will recommend screening for HIV and starting PrEP. Similarly, recommended HPV vaccination which was also refused.  Plan: Check urine and oral cytologies Follow-up on results as needed   Margarite Gouge, PharmD, CPP Clinical Pharmacist Practitioner Infectious Diseases Clinical Pharmacist [facility] 11/14/2021, 2:31 PM

## 2021-11-15 LAB — CYTOLOGY, (ORAL, ANAL, URETHRAL) ANCILLARY ONLY
Chlamydia: NEGATIVE
Comment: NEGATIVE
Comment: NORMAL
Neisseria Gonorrhea: NEGATIVE

## 2021-11-15 LAB — URINE CYTOLOGY ANCILLARY ONLY
Chlamydia: NEGATIVE
Comment: NEGATIVE
Comment: NORMAL
Neisseria Gonorrhea: NEGATIVE

## 2022-01-24 ENCOUNTER — Other Ambulatory Visit: Payer: Self-pay

## 2022-01-24 ENCOUNTER — Other Ambulatory Visit (HOSPITAL_COMMUNITY)
Admission: RE | Admit: 2022-01-24 | Discharge: 2022-01-24 | Disposition: A | Discharge status: Home / Self Care | Payer: BC Managed Care – PPO | Source: Ambulatory Visit | Attending: Infectious Diseases | Admitting: Infectious Diseases

## 2022-01-24 ENCOUNTER — Ambulatory Visit (INDEPENDENT_AMBULATORY_CARE_PROVIDER_SITE_OTHER): Payer: BC Managed Care – PPO | Admitting: Infectious Diseases

## 2022-01-24 ENCOUNTER — Encounter: Payer: Self-pay | Admitting: Infectious Diseases

## 2022-01-24 VITALS — BP 117/71 | HR 52 | Temp 97.3°F | Wt 252.0 lb

## 2022-01-24 DIAGNOSIS — Z113 Encounter for screening for infections with a predominantly sexual mode of transmission: Secondary | ICD-10-CM | POA: Insufficient documentation

## 2022-01-24 DIAGNOSIS — J029 Acute pharyngitis, unspecified: Secondary | ICD-10-CM | POA: Diagnosis not present

## 2022-01-24 HISTORY — DX: Acute pharyngitis, unspecified: J02.9

## 2022-01-24 MED ORDER — DOXYCYCLINE MONOHYDRATE 100 MG PO CAPS: 200.0000 mg | ORAL_CAPSULE | ORAL | 0 refills | Status: DC | PRN

## 2022-01-24 MED ORDER — DOXYCYCLINE MONOHYDRATE 100 MG PO CAPS
200.0000 mg | ORAL_CAPSULE | ORAL | 0 refills | Status: DC | PRN
  Filled 2022-01-24: qty 30, fill #0

## 2022-01-24 NOTE — Assessment & Plan Note (Signed)
With description of symptoms and no other associated symptoms and no lymphadenopathy I would attribute this mostly to allergy mediated process. Will swab for bacterial STI and GAS culture however given how red it appears today.  Twice daily antihistamines, stay hydrated.  4th generation screening also ordered to rule out acute HIV.  Further recommendations pending test results.

## 2022-01-24 NOTE — Patient Instructions (Addendum)
For the sore throat - try over the counter Benadryl at night and either Zyrtec or Claritin once every morning. This may help your sure throat if it is due to allergies.  Will also swab to see if any bacteria there.   I would like to try a new way to prevent sexually transmitted infections for you with DOXY-PEP.  This has been shown to reduce syphilis and chlamydia re-infections by up to 70% in some studies.   How to take your antibiotic? Take TWO tablets (200 mg) of Doxycycline ONCE with food and a full glass of water within 24 hours (no later than 72 hours) after condomless sex.   Will contact you on your mychart portal with your results as they come in.

## 2022-01-24 NOTE — Assessment & Plan Note (Addendum)
Oropharyngeal and urine testing collected today for asymptomatic screening including gonorrhea/chlamydia and trichomoniasis given male partners.  RPR and 4th generation HIV testing ordered today with patient's consent for blood work.  He has in the past not been interested in PREP. Happy to help if he ever wants to consider this.   We did discuss the utility of DOXY PEP for prevention of bacterial STIs. He was agreeable to this today. Instructions and doxy precautions discussed - Take TWO tablets (200 mg) of Doxycycline ONCE with food and a full glass of water within 24 hours (no later than 72 hours) after condomless sex.   Further treatment recommendations pending testing results today. Return PRN.

## 2022-01-24 NOTE — Progress Notes (Signed)
Subjective:     Paul Bridges is a 21 y.o. male patient here today with concern over STI exposure/symptoms .   No GI/GU symptoms at present. No skin rashes. No known exposure but would like to have peace of mind to screen. OK with doing blood test today as it has been a while since screened.   He has had a sore throat that is not terribly new for him but feels it is getting a little worse. Changes in the weather/season usually prompts this. Denies any fevers, chills, headaches, lymphadenopathy  Had headache yesterday but not terribly uncommon for him and feels more r/t work environment.    Review of Systems: Review of Systems  Constitutional:  Negative for chills and fever.  HENT:  Positive for postnasal drip. Negative for rhinorrhea, sinus pressure, sinus pain, sneezing and sore throat.   Eyes:  Negative for visual disturbance.  Gastrointestinal:  Negative for abdominal pain, anal bleeding and rectal pain.  Genitourinary:  Negative for dysuria, genital sores, penile discharge, penile pain, scrotal swelling and testicular pain.  Musculoskeletal:  Negative for arthralgias and joint swelling.  Skin:  Negative for rash.  Neurological:  Negative for headaches.  Hematological:  Negative for adenopathy.    Sexual History:  Prefers male partners, oropharyngeal and penile sites exposed Condom use inconsistent PrEP?: no, refuses    Past Medical History:  Diagnosis Date   Chronic tonsillitis    H/O testicular mass     Outpatient Medications Prior to Visit  Medication Sig Dispense Refill   Ascorbic Acid (VITAMIN C) 100 MG tablet Take 100 mg by mouth daily.     cholecalciferol (VITAMIN D3) 25 MCG (1000 UNIT) tablet Take 1,000 Units by mouth daily.     baclofen (LIORESAL) 10 MG tablet Take 1 tablet (10 mg total) by mouth 3 (three) times daily. (Patient not taking: Reported on 01/24/2022) 30 each 0   doxycycline (VIBRA-TABS) 100 MG tablet Take 1 tablet (100 mg total) by  mouth 2 (two) times daily. (Patient not taking: Reported on 01/24/2022) 14 tablet 0   predniSONE (DELTASONE) 20 MG tablet Take 3 tabs PO daily x 5 days. (Patient not taking: Reported on 01/24/2022) 15 tablet 0   No facility-administered medications prior to visit.    No Known Allergies  Family History  Problem Relation Age of Onset   Diabetes Mother    Hyperlipidemia Mother    Hypertension Mother         Objective:  Objective  Vitals:   01/24/22 1012  BP: 117/71  Pulse: (!) 52  Temp: (!) 97.3 F (36.3 C)  SpO2: 100%   Body mass index is 31.5 kg/m.   Physical Exam  Physical Exam Constitutional:      Appearance: Normal appearance. He is not ill-appearing.  HENT:     Nose: No congestion or rhinorrhea.     Mouth/Throat:     Mouth: Mucous membranes are moist.     Pharynx: Oropharynx is clear. Posterior oropharyngeal erythema (significant oropharyngeal erythema and swelling. No purulence/patches but difficult to see with tongue size.) present.  Eyes:     General: No scleral icterus.    Conjunctiva/sclera: Conjunctivae normal.     Pupils: Pupils are equal, round, and reactive to light.  Cardiovascular:     Rate and Rhythm: Normal rate.  Pulmonary:     Breath sounds: Normal breath sounds.  Abdominal:     General: There is no distension.  Musculoskeletal:  Cervical back: No tenderness.  Lymphadenopathy:     Head:     Right side of head: No submental, submandibular, tonsillar, preauricular or posterior auricular adenopathy.     Left side of head: No submental, submandibular, tonsillar, preauricular or posterior auricular adenopathy.     Cervical: No cervical adenopathy.  Neurological:     Mental Status: He is alert.        Assessment & Plan:    Problem List Items Addressed This Visit       Unprioritized   Sore throat - Primary    With description of symptoms and no other associated symptoms and no lymphadenopathy I would attribute this mostly to allergy  mediated process. Will swab for bacterial STI and GAS culture however given how red it appears today.  Twice daily antihistamines, stay hydrated.  4th generation screening also ordered to rule out acute HIV.  Further recommendations pending test results.       Relevant Orders   Culture, Group A Strep   HIV antibody (with reflex)   Routine screening for STI (sexually transmitted infection)    Oropharyngeal and urine testing collected today for asymptomatic screening including gonorrhea/chlamydia and trichomoniasis given male partners.  RPR and 4th generation HIV testing ordered today with patient's consent for blood work.  He has in the past not been interested in PREP. Happy to help if he ever wants to consider this.   We did discuss the utility of DOXY PEP for prevention of bacterial STIs. He was agreeable to this today. Instructions and doxy precautions discussed - Take TWO tablets (200 mg) of Doxycycline ONCE with food and a full glass of water within 24 hours (no later than 72 hours) after condomless sex.   Further treatment recommendations pending testing results today. Return PRN.        Relevant Orders   Urine cytology ancillary only([place])   Cytology (oral, anal, urethral) ancillary only   RPR   HIV antibody (with reflex)    Janene Madeira, MSN, NP-C [facility] [place] [facility] Office: [phone] Pager: [phone]

## 2022-01-25 LAB — CYTOLOGY, (ORAL, ANAL, URETHRAL) ANCILLARY ONLY
Chlamydia: NEGATIVE
Comment: NEGATIVE
Comment: NORMAL
Neisseria Gonorrhea: NEGATIVE

## 2022-01-25 LAB — URINE CYTOLOGY ANCILLARY ONLY
Chlamydia: NEGATIVE
Comment: NEGATIVE
Comment: NEGATIVE
Comment: NORMAL
Neisseria Gonorrhea: NEGATIVE
Trichomonas: NEGATIVE

## 2022-01-26 LAB — CULTURE, GROUP A STREP
MICRO NUMBER:: 13955363
SPECIMEN QUALITY:: ADEQUATE

## 2022-01-26 LAB — RPR: RPR Ser Ql: NONREACTIVE

## 2022-01-26 LAB — HIV ANTIBODY (ROUTINE TESTING W REFLEX): HIV 1&2 Ab, 4th Generation: NONREACTIVE

## 2022-01-30 ENCOUNTER — Ambulatory Visit: Payer: Self-pay

## 2022-06-11 ENCOUNTER — Ambulatory Visit (INDEPENDENT_AMBULATORY_CARE_PROVIDER_SITE_OTHER): Payer: Self-pay | Admitting: Infectious Diseases

## 2022-06-11 ENCOUNTER — Encounter: Payer: Self-pay | Admitting: Infectious Diseases

## 2022-06-11 ENCOUNTER — Other Ambulatory Visit (HOSPITAL_COMMUNITY)
Admission: RE | Admit: 2022-06-11 | Discharge: 2022-06-11 | Disposition: A | Discharge status: Home / Self Care | Payer: Self-pay | Source: Ambulatory Visit | Attending: Infectious Diseases | Admitting: Infectious Diseases

## 2022-06-11 ENCOUNTER — Other Ambulatory Visit: Payer: Self-pay

## 2022-06-11 ENCOUNTER — Other Ambulatory Visit (HOSPITAL_COMMUNITY): Payer: Self-pay

## 2022-06-11 VITALS — BP 123/72 | HR 46 | Temp 98.6°F | Resp 16 | Ht 74.0 in | Wt 239.0 lb

## 2022-06-11 DIAGNOSIS — Z113 Encounter for screening for infections with a predominantly sexual mode of transmission: Secondary | ICD-10-CM | POA: Insufficient documentation

## 2022-06-11 NOTE — Assessment & Plan Note (Signed)
Urine and oropharyngeal collections today. Tx as indicated after results back given no symptoms or known exposures today.  Recommend consideration of HIV prevention / testing as well. He was negative in September 2023.  Has doxypep in his car but has not used yet.   RTC prn.

## 2022-06-11 NOTE — Progress Notes (Signed)
      Subjective:     Paul Bridges is a 22 y.o. male patient here today with concern over STI exposure/symptoms .   He would like routine STI screening today. No symptoms. Male partners only. No known exposures.    Review of Systems: Review of Systems  Constitutional:  Negative for chills and fever.  HENT:  Negative for sore throat.   Eyes:  Negative for visual disturbance.  Gastrointestinal:  Negative for abdominal pain, anal bleeding and rectal pain.  Genitourinary:  Negative for dysuria, genital sores, penile discharge, penile pain, scrotal swelling and testicular pain.  Musculoskeletal:  Negative for arthralgias and joint swelling.  Skin:  Negative for rash.  Neurological:  Negative for headaches.  Hematological:  Negative for adenopathy.    Sexual History:  Prefers male partners, oropharyngeal and penile sites exposed Condom use inconsistent PrEP?: no, refuses    Past Medical History:  Diagnosis Date   Chronic tonsillitis    H/O testicular mass     Outpatient Medications Prior to Visit  Medication Sig Dispense Refill   Ascorbic Acid (VITAMIN C) 100 MG tablet Take 100 mg by mouth daily. (Patient not taking: Reported on 06/11/2022)     baclofen (LIORESAL) 10 MG tablet Take 1 tablet (10 mg total) by mouth 3 (three) times daily. (Patient not taking: Reported on 01/24/2022) 30 each 0   cholecalciferol (VITAMIN D3) 25 MCG (1000 UNIT) tablet Take 1,000 Units by mouth daily. (Patient not taking: Reported on 06/11/2022)     doxycycline (MONODOX) 100 MG capsule Take 2 capsules (200 mg total) by mouth as needed. (Patient not taking: Reported on 06/11/2022) 30 capsule 0   doxycycline (VIBRA-TABS) 100 MG tablet Take 1 tablet (100 mg total) by mouth 2 (two) times daily. (Patient not taking: Reported on 01/24/2022) 14 tablet 0   predniSONE (DELTASONE) 20 MG tablet Take 3 tabs PO daily x 5 days. (Patient not taking: Reported on 01/24/2022) 15 tablet 0   No facility-administered  medications prior to visit.    No Known Allergies  Family History  Problem Relation Age of Onset   Diabetes Mother    Hyperlipidemia Mother    Hypertension Mother         Objective:  Objective  Vitals:   06/11/22 1417  BP: 123/72  Pulse: (!) 46  Resp: 16  Temp: 98.6 F (37 C)  SpO2: 98%   Body mass index is 30.69 kg/m.   Physical Exam  Physical Exam    Assessment & Plan:    Problem List Items Addressed This Visit       Unprioritized   Routine screening for STI (sexually transmitted infection) - Primary    Urine and oropharyngeal collections today. Tx as indicated after results back given no symptoms or known exposures today.  Recommend consideration of HIV prevention / testing as well. He was negative in September 2023.  Has doxypep in his car but has not used yet.   RTC prn.       Relevant Orders   Cytology (oral, anal, urethral) ancillary only   Urine cytology ancillary only   Janene Madeira, MSN, NP-C [facility] for [place] [facility] Office: [phone] Pager: [phone]

## 2022-06-11 NOTE — Patient Instructions (Signed)
Will let you know what your results show today on your MyChart.

## 2022-06-12 LAB — CYTOLOGY, (ORAL, ANAL, URETHRAL) ANCILLARY ONLY
Chlamydia: NEGATIVE
Comment: NEGATIVE
Comment: NORMAL
Neisseria Gonorrhea: NEGATIVE

## 2022-06-12 LAB — URINE CYTOLOGY ANCILLARY ONLY
Chlamydia: NEGATIVE
Comment: NEGATIVE
Comment: NEGATIVE
Comment: NORMAL
Neisseria Gonorrhea: NEGATIVE
Trichomonas: NEGATIVE

## 2022-06-12 NOTE — Addendum Note (Signed)
Addended by: [place] Callas on: 06/12/2022 05:17 PM   Modules accepted: Level of Service

## 2022-08-06 ENCOUNTER — Other Ambulatory Visit (HOSPITAL_COMMUNITY)
Admission: RE | Admit: 2022-08-06 | Discharge: 2022-08-06 | Disposition: A | Discharge status: Home / Self Care | Payer: BC Managed Care – PPO | Source: Ambulatory Visit | Attending: Infectious Diseases | Admitting: Infectious Diseases

## 2022-08-06 ENCOUNTER — Ambulatory Visit (INDEPENDENT_AMBULATORY_CARE_PROVIDER_SITE_OTHER): Payer: BC Managed Care – PPO | Admitting: Pharmacist

## 2022-08-06 ENCOUNTER — Other Ambulatory Visit: Payer: Self-pay

## 2022-08-06 DIAGNOSIS — Z113 Encounter for screening for infections with a predominantly sexual mode of transmission: Secondary | ICD-10-CM | POA: Diagnosis not present

## 2022-08-06 NOTE — Progress Notes (Unsigned)
08/06/2022  HPI: Paul Bridges is a 22 y.o. male who presents to the [place] clinic today for STI testing.  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   BACLOFEN (LIORESAL) 10 MG TABLET    Take 1 tablet (10 mg total) by mouth 3 (three) times daily.   CHOLECALCIFEROL (VITAMIN D3) 25 MCG (1000 UNIT) TABLET    Take 1,000 Units by mouth daily.   DOXYCYCLINE (MONODOX) 100 MG CAPSULE    Take 2 capsules (200 mg total) by mouth as needed.   DOXYCYCLINE (VIBRA-TABS) 100 MG TABLET    Take 1 tablet (100 mg total) by mouth 2 (two) times daily.  Modified Medications   No medications on file  Discontinued Medications   No medications on file    Allergies: No Known Allergies  Past Medical History: Past Medical History:  Diagnosis Date   Chronic tonsillitis    H/O testicular mass     Social History: Social History   Socioeconomic History   Marital status: Single    Spouse name: Not on file   Number of children: Not on file   Years of education: Not on file   Highest education level: Not on file  Occupational History   Not on file  Tobacco Use   Smoking status: Never   Smokeless tobacco: Never  Vaping Use   Vaping Use: Never used  Substance and Sexual Activity   Alcohol use: Yes    Alcohol/week: 1.0 standard drink of alcohol    Types: 1 Glasses of wine per week   Drug use: Not Currently    Types: Marijuana    Comment: once every couple months   Sexual activity: Yes    Partners: Female    Birth control/protection: Condom  Other Topics Concern   Not on file  Social History Narrative   Single.   No children.   Works in Therapist, art.   Student at [employer] Strain: Not on file  Food Insecurity: Not on file  Transportation Needs: Not on file  Physical Activity: Not on file  Stress: Not on file  Social Connections: Not on file     Assessment: Paul presents to clinic today for routine STI testing following a recent sexual encounter where he did not use a condom. He is not experiencing any symptoms consistent with a STI and has no known exposures. He is on doxycycline for PEP. He states he rarely has unprotected sex and therefore does not have to use doxycycline frequently. He states the only side effect he experiences with doxycycline is stomach upset/queasiness which I advised him could be mitigated by taking the medication with food. We also discussed whether he would be interested in PrEP for HIV given he has come to the clinic on a few occasions for STI testing. He informed me he was not interested in PrEP and he rarely has unprotected sexual encounters. I reiterated that it is routine practice in the [facility] to discuss PrEP at each visit for STI screening. He voiced understanding.   He has previously stated he has an aversion to  needles but wished to have HIV testing done today. As such, I asked him if he would be willing to have other labs drawn at this time and he voiced his consent. Additional labs to be drawn include Hepatitis B, Hepatitis A, and Hepatitis C serologies for routine screening.   We also discussed he is eligible for the HPV vaccination series, COVID vaccine, and Tdap vaccine. He politely declined all vaccination.   Plan: - STI screening: RPR, urine/rectal/pharyngeal GC/CT swabs for cytology today - HIV Ab, HepB Sab, HepB sAg, HAV Ab, HepC Ab  - F/u results to see if treatment is needed  Adria Dill, PharmD PGY-2 Infectious Diseases Resident  08/06/2022 2:19 PM

## 2022-08-07 LAB — HIV ANTIBODY (ROUTINE TESTING W REFLEX): HIV 1&2 Ab, 4th Generation: NONREACTIVE

## 2022-08-07 LAB — RPR: RPR Ser Ql: NONREACTIVE

## 2022-08-07 LAB — URINE CYTOLOGY ANCILLARY ONLY
Chlamydia: NEGATIVE
Comment: NEGATIVE
Comment: NORMAL
Neisseria Gonorrhea: NEGATIVE

## 2022-08-07 LAB — CYTOLOGY, (ORAL, ANAL, URETHRAL) ANCILLARY ONLY
Chlamydia: NEGATIVE
Comment: NEGATIVE
Comment: NORMAL
Neisseria Gonorrhea: NEGATIVE

## 2022-08-07 LAB — HEPATITIS C ANTIBODY: Hepatitis C Ab: NONREACTIVE

## 2022-08-07 LAB — HEPATITIS A ANTIBODY, TOTAL: Hepatitis A AB,Total: NONREACTIVE

## 2022-08-07 LAB — HEPATITIS B SURFACE ANTIBODY,QUALITATIVE: Hep B S Ab: NONREACTIVE

## 2022-08-07 LAB — HEPATITIS B SURFACE ANTIGEN: Hepatitis B Surface Ag: NONREACTIVE

## 2022-08-07 NOTE — Progress Notes (Signed)
NEW REFERRAL TO [facility]  

## 2022-11-01 ENCOUNTER — Emergency Department (HOSPITAL_BASED_OUTPATIENT_CLINIC_OR_DEPARTMENT_OTHER): Payer: BC Managed Care – PPO

## 2022-11-01 ENCOUNTER — Encounter (HOSPITAL_BASED_OUTPATIENT_CLINIC_OR_DEPARTMENT_OTHER): Payer: Self-pay

## 2022-11-01 ENCOUNTER — Emergency Department (HOSPITAL_BASED_OUTPATIENT_CLINIC_OR_DEPARTMENT_OTHER)
Admission: EM | Admit: 2022-11-01 | Discharge: 2022-11-01 | Disposition: A | Discharge status: Home / Self Care | Payer: BC Managed Care – PPO | Attending: Emergency Medicine | Admitting: Emergency Medicine

## 2022-11-01 ENCOUNTER — Other Ambulatory Visit: Payer: Self-pay

## 2022-11-01 DIAGNOSIS — N503 Cyst of epididymis: Secondary | ICD-10-CM | POA: Diagnosis not present

## 2022-11-01 DIAGNOSIS — N50811 Right testicular pain: Secondary | ICD-10-CM | POA: Insufficient documentation

## 2022-11-01 LAB — URINALYSIS, ROUTINE W REFLEX MICROSCOPIC
Bilirubin Urine: NEGATIVE
Glucose, UA: NEGATIVE mg/dL
Hgb urine dipstick: NEGATIVE
Ketones, ur: NEGATIVE mg/dL
Leukocytes,Ua: NEGATIVE
Nitrite: NEGATIVE
Protein, ur: NEGATIVE mg/dL
Specific Gravity, Urine: 1.012 (ref 1.005–1.030)
pH: 6.5 (ref 5.0–8.0)

## 2022-11-01 NOTE — ED Notes (Signed)
Called lab to add GC/Chlamydia to U/A

## 2022-11-01 NOTE — Discharge Instructions (Signed)
You were seen in emergency room today with testicle pain.  Your ultrasound did not show any twisting or torsion of the testicle.  You may consider alternating Tylenol with ibuprofen which can reduce inflammation as well as block pain.  You may also find that wearing tighter fitting underwear is more comfortable in the near term.

## 2022-11-01 NOTE — ED Triage Notes (Signed)
POV from home, A&O x 4, GCS 15, amb to room  C/o testicle pain x 3 days, sts he thinks its more on the right testicle, started when he woke from sleep, no associated urinary symptoms.

## 2022-11-01 NOTE — ED Notes (Signed)
To US

## 2022-11-01 NOTE — ED Provider Notes (Signed)
Emergency Department Provider Note   I have reviewed the triage vital signs and the nursing notes.   HISTORY  Chief Complaint Testicle Pain   HPI Paul Bridges is a 22 y.o. male with past history reviewed below presents emergency department for evaluation of testicle pain.  Symptoms been ongoing for the past 3 days.  He states he has been sleeping without boxers recently and rolled over in bed 3 nights ago.  He awoke with sudden pain/soreness in the right testicle.  He was able to get back to sleep and has been managing symptoms at home.  A friend who has a prior history of torsion advised that he present to the ED for evaluation.  He denies being sexually active.  He is not having dysuria or urethral discharge.  No back or flank pain. No abdominal pain.   Past Medical History:  Diagnosis Date   Chronic tonsillitis    H/O testicular mass     Review of Systems  Constitutional: No fever/chills. Cardiovascular: Denies chest pain. Respiratory: Denies shortness of breath. Gastrointestinal: Positive abdominal pain.  No nausea, no vomiting.  No diarrhea.  No constipation. Genitourinary: Negative for dysuria. Positive testicle pain.  Musculoskeletal: Negative for back pain. Skin: Negative for rash. Neurological: Negative for headaches.  ____________________________________________   PHYSICAL EXAM:  VITAL SIGNS: ED Triage Vitals  Enc Vitals Group     BP 11/01/22 2026 122/66     Pulse Rate 11/01/22 2026 (!) 56     Resp 11/01/22 2026 17     Temp 11/01/22 2026 98.6 F (37 C)     Temp Source 11/01/22 2026 Oral     SpO2 11/01/22 2026 100 %     Weight 11/01/22 2027 250 lb (113.4 kg)     Height 11/01/22 2027 6\' 2"  (1.88 m)   Constitutional: Alert and oriented. Well appearing and in no acute distress. Eyes: Conjunctivae are normal. Head: Atraumatic. Nose: No congestion/rhinnorhea. Mouth/Throat: Mucous membranes are moist.  Neck: No stridor.  Cardiovascular: Normal  rate, regular rhythm. Good peripheral circulation. Grossly normal heart sounds.   Respiratory: Normal respiratory effort.  No retractions. Lungs CTAB. Gastrointestinal: Soft and nontender. No distention.  Genitourinary: Exam performed after obtaining patient's verbal consent and with nurse chaperone.  Normal external genitalia.  Normal lie of testicles.  No focal tenderness or masses on either the right or left testicle.  Musculoskeletal: No gross deformities of extremities. Neurologic:  Normal speech and language.  Skin:  Skin is warm, dry and intact. No rash noted.  ____________________________________________   LABS (all labs ordered are listed, but only abnormal results are displayed)  Labs Reviewed  URINALYSIS, ROUTINE W REFLEX MICROSCOPIC - Abnormal; Notable for the following components:      Result Value   Color, Urine COLORLESS (*)    All other components within normal limits  GC/CHLAMYDIA PROBE AMP ([place]) NOT AT [facility]   ________________________________________   PROCEDURES  Procedure(s) performed:   Procedures  None  ____________________________________________   INITIAL IMPRESSION / ASSESSMENT AND PLAN / ED COURSE  Pertinent labs & imaging results that were available during my care of the patient were reviewed by me and considered in my medical decision making (see chart for details).   This patient is Presenting for Evaluation of testicle pain, which does require a range of treatment options, and is a complaint that involves a high risk of morbidity and mortality.  The Differential Diagnoses includes but is not exclusive to acute appendicitis, renal  colic, testicular torsion, urinary tract infection, prostatitis,  diverticulitis, small bowel obstruction, colitis, abdominal aortic aneurysm, gastroenteritis, constipation etc.   Clinical Laboratory Tests Ordered, included UA without hemoglobin or infection.  Gonorrhea/chlamydia pending. Patient to follow in the  MyChart app.   Radiologic Tests Ordered, included US scrotum. I independently interpreted the images and agree with radiology interpretation.   Medical Decision Making: Summary:  Patient presents emergency department with testicle soreness for the past 3 days after rolling over in bed.  On exam my suspicion for torsion is low but I do not feel that ultrasound is indicated here for further assessment.  Not high risk for STI, orchitis, UTI.  Reevaluation with update and discussion with patient.  Ultrasound shows no torsion.  UA unremarkable.  Plan to send gonorrhea/chlamydia and patient will follow the test results in the MyChart app although has not been sexually active recently or having symptoms.  Patient's presentation is most consistent with acute, uncomplicated illness.   Disposition: discharge  ____________________________________________  FINAL CLINICAL IMPRESSION(S) / ED DIAGNOSES  Final diagnoses:  Pain in right testicle    Note:  This document was prepared using Dragon voice recognition software and may include unintentional dictation errors.  Alona Bene, MD, [facility] Emergency Medicine    Bathsheba Durrett, Arlyss Repress, MD 11/05/22 [phone]

## 2022-11-04 LAB — GC/CHLAMYDIA PROBE AMP (~~LOC~~) NOT AT ARMC
Chlamydia: NEGATIVE
Comment: NEGATIVE
Comment: NORMAL
Neisseria Gonorrhea: NEGATIVE

## 2022-11-05 ENCOUNTER — Telehealth: Payer: Self-pay

## 2022-11-05 NOTE — Telephone Encounter (Signed)
Allayne Gitelman NP last seen 09/21/2019; pt was seen [facility] ED on 0628/24 and pt was to FU with PCP. Since more than 3 yrs since seen do you want to [facility] ED FU.? Sending note to Allayne Gitelman NP.

## 2022-11-05 NOTE — Telephone Encounter (Signed)
I am fine to take patient back.

## 2022-11-06 NOTE — Transitions of Care (Post Inpatient/ED Visit) (Signed)
Left v/m for pt to call [phone].     11/06/2022  Name: Paul Bridges MRN: [id number] DOB: 27-Jan-2001  Today's TOC FU Call Status: Today's TOC FU Call Status:: Unsuccessul Call (1st Attempt) Unsuccessful Call (1st Attempt) Date: 11/06/22  Attempted to reach the patient regarding the most recent Inpatient/ED visit.  Follow Up Plan: Additional outreach attempts will be made to reach the patient to complete the Transitions of Care (Post Inpatient/ED visit) call.   Signature Lewanda Rife, LPN

## 2022-11-06 NOTE — Telephone Encounter (Signed)
LVM for patient to call back and schedule

## 2023-03-07 ENCOUNTER — Emergency Department (HOSPITAL_BASED_OUTPATIENT_CLINIC_OR_DEPARTMENT_OTHER)
Admission: EM | Admit: 2023-03-07 | Discharge: 2023-03-07 | Disposition: A | Discharge status: Home / Self Care | Payer: BC Managed Care – PPO | Attending: Emergency Medicine | Admitting: Emergency Medicine

## 2023-03-07 ENCOUNTER — Other Ambulatory Visit: Payer: Self-pay

## 2023-03-07 DIAGNOSIS — Z20822 Contact with and (suspected) exposure to covid-19: Secondary | ICD-10-CM | POA: Insufficient documentation

## 2023-03-07 DIAGNOSIS — R590 Localized enlarged lymph nodes: Secondary | ICD-10-CM | POA: Insufficient documentation

## 2023-03-07 LAB — GROUP A STREP BY PCR: Group A Strep by PCR: NOT DETECTED

## 2023-03-07 LAB — RESP PANEL BY RT-PCR (RSV, FLU A&B, COVID)  RVPGX2
Influenza A by PCR: NEGATIVE
Influenza B by PCR: NEGATIVE
Resp Syncytial Virus by PCR: NEGATIVE
SARS Coronavirus 2 by RT PCR: NEGATIVE

## 2023-03-07 NOTE — ED Provider Notes (Signed)
[place] EMERGENCY DEPARTMENT AT [facility] Provider Note   CSN: [id number] Arrival date & time: 03/07/23  1141     History Chief Complaint  Patient presents with   Sore Throat    Paul Bridges is a 22 y.o. male reportedly otherwise healthy presents emerged part today for evaluation of left-sided cervical lymphadenopathy for the past 2 to 3 days.  Patient reports he had a painful lump in his throat and was wondering what was causing this.  He denies any fevers, chills, sore throat, runny nose, nasal congestion, rash, headache, weight loss, foreign travel, cat bites, or night sweats.  He denies any recent cough or cold symptoms.  He denies any previous injury to the area or any overlying pimples or rash.  He denies any chest pain or shortness of breath.  He denies any medical or surgical history.  No daily medications.  No known drug allergies.  Denies any tobacco, EtOH illicit drug use.   Sore Throat Pertinent negatives include no chest pain, no abdominal pain, no headaches and no shortness of breath.       Home Medications Prior to Admission medications   Medication Sig Start Date End Date Taking? Authorizing Provider  Ascorbic Acid (VITAMIN C) 100 MG tablet Take 100 mg by mouth daily. Patient not taking: Reported on 06/11/2022    [provider]  baclofen (LIORESAL) 10 MG tablet Take 1 tablet (10 mg total) by mouth 3 (three) times daily. Patient not taking: Reported on 01/24/2022 12/02/20   [name], FNP  cholecalciferol (VITAMIN D3) 25 MCG (1000 UNIT) tablet Take 1,000 Units by mouth daily. Patient not taking: Reported on 06/11/2022    [provider]  doxycycline (MONODOX) 100 MG capsule Take 2 capsules (200 mg total) by mouth as needed. Patient not taking: Reported on 06/11/2022 01/24/22   [name], NP  doxycycline (VIBRA-TABS) 100 MG tablet Take 1 tablet (100 mg total) by mouth 2 (two) times daily. Patient not taking: Reported on  01/24/2022 10/15/21   Kuppelweiser, [name], RPH-CPP      Allergies    Patient has no known allergies.    Review of Systems   Review of Systems  Constitutional:  Negative for chills and fever.  HENT:  Negative for congestion, rhinorrhea and sore throat.   Respiratory:  Negative for cough and shortness of breath.   Cardiovascular:  Negative for chest pain.  Gastrointestinal:  Negative for abdominal pain, nausea and vomiting.  Neurological:  Negative for headaches.  See HPI  Physical Exam Updated Vital Signs BP 120/82 (BP Location: Right Arm)   Pulse 60   Temp 98 F (36.7 C) (Oral)   Resp 14   SpO2 100%  Physical Exam Vitals and nursing note reviewed.  Constitutional:      General: He is not in acute distress.    Appearance: He is not ill-appearing or toxic-appearing.  HENT:     Mouth/Throat:     Mouth: Mucous membranes are moist.     Pharynx: Uvula midline. No oropharyngeal exudate, posterior oropharyngeal erythema or uvula swelling.     Tonsils: No tonsillar exudate or tonsillar abscesses.     Comments: Moist mucous membranes.  Uvula midline.  Airway patent.  No pharyngeal erythema, edema, or exudate noted.  No sublingual elevation.  Dentition appears to be intact.  No abscess or induration seen or palpated.  No trismus.  Controlling secretions.  Normal speech. Neck:      Comments: Small node palpated  in the marked area above.  Mobile.  Tender to palpation.  No overlying erythema or warmth.  No swelling noted.  There is no fluctuance or induration appreciated.  Question of this is cervical lymphadenopathy versus a thyroid nodule.  I do not appreciate any other cervical or supraclavicular nodes Cardiovascular:     Rate and Rhythm: Normal rate.  Pulmonary:     Effort: Pulmonary effort is normal. No respiratory distress.  Musculoskeletal:     Cervical back: Normal range of motion.  Skin:    General: Skin is warm and dry.  Neurological:     Mental Status: He is alert.      ED Results / Procedures / Treatments   Labs (all labs ordered are listed, but only abnormal results are displayed) Labs Reviewed  GROUP A STREP BY PCR  RESP PANEL BY RT-PCR (RSV, FLU A&B, COVID)  [id number]    EKG None  Radiology No results found.  Procedures Procedures   Medications Ordered in ED Medications - No data to display  ED Course/ Medical Decision Making/ A&P   Medical Decision Making  23 y.o. male presents to the ER for evaluation of palpable sore lymph node. Differential diagnosis includes but is not limited to reactive lymphadenopathy, thyroid nodule, malignancy. Vital signs unremarkable. Physical exam as noted above.   Labs ordered in triage.  I independently reviewed and interpreted the patient's labs.  COVID, flu, RSV negative.  Strep is negative.  The patient is hemodynamically stable here.  Afebrile.  Denies any B symptoms.  No recent cat bites or scratches.  Denies any night sweats or any foreign travel.  No unintended weight loss.  The note is sore upon palpation.  I discussed with the patient that I could do some lab work however he declines this.  Discussed with him that ultimately he will need to follow-up with his primary care doctor for further evaluation and monitoring of this.  The note is only been present for the past 2 to 3 days.  Could just be reactive but will have him follow-up with his primary care doctor for further monitoring of this.  There is no fluctuance or induration or any overlying skin changes noted to the area.  Doubt any cellulitis or abscess at this time.  I have messaged his primary care provider to make aware of the visit today.  He stable for outpatient follow-up.  We discussed the results of the labs/imaging. The plan is follow-up with primary care provider. We discussed strict return precautions and red flag symptoms. The patient verbalized their understanding and agrees to the plan. The patient is stable and being discharged  home in good condition.  Portions of this report may have been transcribed using voice recognition software. Every effort was made to ensure accuracy; however, inadvertent computerized transcription errors may be present.   Final Clinical Impression(s) / ED Diagnoses Final diagnoses:  Reactive cervical lymphadenopathy    Rx / DC Orders ED Discharge Orders     None         Achille Rich, PA-C 03/07/23 2048    Anders Simmonds T, DO 03/08/23 1235

## 2023-03-07 NOTE — ED Triage Notes (Signed)
Pt reports sore throat along with hard swollen nodule on upper neck below left jaw line for 2-3 days.

## 2023-03-07 NOTE — Discharge Instructions (Addendum)
You were seen in the ER today for evaluation of your lymph node. I would like for you to follow up with your primary care provider about this. Usually, they like to watch these to make sure that they aren't worsening. Sometimes they order an ultrasound or will biopsy if it meets the criteria. Ultimately, you will need to see your primary care provider for follow up and continuation of care. If you have any concerns, new or worsening symptoms, please return to the nearest ER for re-evaluation.   Contact a health care provider if: You have lymph nodes that: Are still swollen after 2 weeks. Have gotten bigger all of a sudden or the swelling spreads. Are red, painful, or hard. Fluid leaks from the skin near a swollen lymph node. You get a fever, chills, or night sweats. You feel tired. You have a sore throat. Your abdomen hurts. You lose weight without trying.

## 2023-03-13 ENCOUNTER — Ambulatory Visit: Payer: Self-pay | Admitting: Primary Care

## 2023-03-19 ENCOUNTER — Ambulatory Visit: Payer: Self-pay | Admitting: Primary Care

## 2023-03-20 ENCOUNTER — Encounter: Payer: Self-pay | Admitting: Primary Care

## 2023-04-25 ENCOUNTER — Ambulatory Visit: Payer: Self-pay

## 2023-04-27 ENCOUNTER — Emergency Department (HOSPITAL_BASED_OUTPATIENT_CLINIC_OR_DEPARTMENT_OTHER)
Admission: EM | Admit: 2023-04-27 | Discharge: 2023-04-27 | Disposition: A | Discharge status: Home / Self Care | Payer: Self-pay | Attending: Emergency Medicine | Admitting: Emergency Medicine

## 2023-04-27 ENCOUNTER — Encounter (HOSPITAL_BASED_OUTPATIENT_CLINIC_OR_DEPARTMENT_OTHER): Payer: Self-pay | Admitting: Emergency Medicine

## 2023-04-27 ENCOUNTER — Other Ambulatory Visit: Payer: Self-pay

## 2023-04-27 DIAGNOSIS — Z202 Contact with and (suspected) exposure to infections with a predominantly sexual mode of transmission: Secondary | ICD-10-CM | POA: Insufficient documentation

## 2023-04-27 NOTE — ED Provider Notes (Signed)
[place] EMERGENCY DEPARTMENT AT [facility] Provider Note   CSN: [id number] Arrival date & time: 04/27/23  1912     History  Chief Complaint  Patient presents with   Exposure to STD    Paul Bridges is a 22 y.o. male presents today for possible STI exposure.  Patient states that he had unprotected sex and his partner told him that he should be tested but did not tell him what STI they had tested positive for.  Patient denies any penile discharge, dysuria, or genital lesions.  Patient denies any fevers, nausea, vomiting, diarrhea, chills, or testicular pain.   Exposure to STD       Home Medications Prior to Admission medications   Medication Sig Start Date End Date Taking? Authorizing Provider  Ascorbic Acid (VITAMIN C) 100 MG tablet Take 100 mg by mouth daily. Patient not taking: Reported on 06/11/2022    [provider]  baclofen (LIORESAL) 10 MG tablet Take 1 tablet (10 mg total) by mouth 3 (three) times daily. Patient not taking: Reported on 01/24/2022 12/02/20   [name], FNP  cholecalciferol (VITAMIN D3) 25 MCG (1000 UNIT) tablet Take 1,000 Units by mouth daily. Patient not taking: Reported on 06/11/2022    [provider]  doxycycline (MONODOX) 100 MG capsule Take 2 capsules (200 mg total) by mouth as needed. Patient not taking: Reported on 06/11/2022 01/24/22   [name], NP  doxycycline (VIBRA-TABS) 100 MG tablet Take 1 tablet (100 mg total) by mouth 2 (two) times daily. Patient not taking: Reported on 01/24/2022 10/15/21   Kuppelweiser, [name], RPH-CPP      Allergies    Patient has no known allergies.    Review of Systems   Review of Systems  Physical Exam Updated Vital Signs BP (!) 140/69   Pulse (!) 56   Temp 98.3 F (36.8 C)   Resp 18   SpO2 100%  Physical Exam Vitals and nursing note reviewed.  Constitutional:      General: He is not in acute distress.    Appearance: Normal appearance. He is  well-developed. He is not ill-appearing.  HENT:     Head: Normocephalic and atraumatic.     Right Ear: External ear normal.     Left Ear: External ear normal.     Nose: Nose normal.     Mouth/Throat:     Mouth: Mucous membranes are moist.  Eyes:     Extraocular Movements: Extraocular movements intact.     Conjunctiva/sclera: Conjunctivae normal.  Cardiovascular:     Rate and Rhythm: Normal rate and regular rhythm.     Pulses: Normal pulses.     Heart sounds: Normal heart sounds. No murmur heard. Pulmonary:     Effort: Pulmonary effort is normal. No respiratory distress.     Breath sounds: Normal breath sounds.  Abdominal:     Palpations: Abdomen is soft.     Tenderness: There is no abdominal tenderness.  Musculoskeletal:        General: No swelling. Normal range of motion.     Cervical back: Normal range of motion and neck supple.  Skin:    General: Skin is warm and dry.     Capillary Refill: Capillary refill takes less than 2 seconds.  Neurological:     General: No focal deficit present.     Mental Status: He is alert.     Motor: No weakness.  Psychiatric:        Mood and  Affect: Mood normal.     ED Results / Procedures / Treatments   Labs (all labs ordered are listed, but only abnormal results are displayed) Labs Reviewed  HIV ANTIBODY (ROUTINE TESTING W REFLEX)  RPR  GC/CHLAMYDIA PROBE AMP ([place]) NOT AT [facility]    EKG None  Radiology No results found.  Procedures Procedures    Medications Ordered in ED Medications - No data to display  ED Course/ Medical Decision Making/ A&P                                 Medical Decision Making Amount and/or Complexity of Data Reviewed Labs: ordered.   This patient presents to the ED with chief complaint(s) of STD exposure with pertinent past medical history of none which further complicates the presenting complaint. The complaint involves an extensive differential diagnosis and also carries with it a high  risk of complications and morbidity.    The differential diagnosis includes gonorrhea, chlamydia, HIV, syphilis  Additional history obtained: Records reviewed previous admission documents  ED Course and Reassessment: Discussed with patient prophylactic treatment for gonorrhea and chlamydia, however patient decided that since he is asymptomatic he would like to wait until his test results come in.  Independent labs interpretation:  The following labs were independently interpreted:  HIV antibody: Pending RPR: Pending GC chlamydia: Pending   Consultation: - Consulted or discussed management/test interpretation w/ external professional: None  Consideration for admission or further workup: Consider for mission or further workup however patient's vital signs and physical exam are reassuring.  Patient does have labs pending and will receive a call if these results require further treatment.  Patient should have follow-up with PCP for further evaluation and treatment if necessary.        Final Clinical Impression(s) / ED Diagnoses Final diagnoses:  Possible exposure to STI    Rx / DC Orders ED Discharge Orders     None         Dolphus Jenny, PA-C 04/27/23 2138    Royanne Foots, DO 05/05/23 1114

## 2023-04-27 NOTE — ED Triage Notes (Signed)
Unprotected sex with new partner. 3 days ago. Was call and told to be check for Std. Denies symptoms

## 2023-04-27 NOTE — Discharge Instructions (Signed)
They were seen for possible exposure to STI.  You have 1 or more labs pending.  When these labs result you will get a call and be prescribed treatment as necessary.  Thank you for letting us treat you today. After performing a physical exam and reviewing your labs, I feel you are safe to go home. Please follow up with your PCP in the next several days and provide them with your records from this visit. Return to the Emergency Room if pain becomes severe or symptoms worsen.

## 2023-04-28 LAB — RPR: RPR Ser Ql: NONREACTIVE

## 2023-04-28 LAB — HIV ANTIBODY (ROUTINE TESTING W REFLEX): HIV Screen 4th Generation wRfx: NONREACTIVE

## 2023-04-29 LAB — GC/CHLAMYDIA PROBE AMP (~~LOC~~) NOT AT ARMC
Chlamydia: NEGATIVE
Comment: NEGATIVE
Comment: NORMAL
Neisseria Gonorrhea: NEGATIVE

## 2023-07-29 ENCOUNTER — Ambulatory Visit (HOSPITAL_COMMUNITY): Payer: Self-pay

## 2023-07-30 ENCOUNTER — Ambulatory Visit: Payer: Self-pay | Admitting: Nurse Practitioner

## 2023-07-30 ENCOUNTER — Encounter: Payer: Self-pay | Admitting: Nurse Practitioner

## 2023-07-30 ENCOUNTER — Other Ambulatory Visit: Payer: Self-pay | Admitting: Family Medicine

## 2023-07-30 DIAGNOSIS — Z113 Encounter for screening for infections with a predominantly sexual mode of transmission: Secondary | ICD-10-CM

## 2023-07-30 LAB — HM HIV SCREENING LAB: HM HIV Screening: NEGATIVE

## 2023-07-30 LAB — GRAM STAIN

## 2023-07-30 LAB — HM HEPATITIS C SCREENING LAB: HM Hepatitis Screen: NEGATIVE

## 2023-07-30 LAB — HEPATITIS B SURFACE ANTIGEN

## 2023-07-30 NOTE — Progress Notes (Signed)
 Pt is here for STD screening. condoms given and Pt given the opportunity to ask questions for clarifications. Sonda Primes, RN.

## 2023-07-30 NOTE — Progress Notes (Signed)
 [facility] Department STI clinic [address], [address] [place] [address] Main phone: [phone]  STI screening visit  Subjective:  Paul Bridges is a 23 y.o. male being seen today for an STI screening visit. The patient reports they do not have symptoms.    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   Patient is a pleasant 23 y.o. male who presents to the office today requesting asymptomatic STI testing. He reports 1 male partner in the last 2 months, and practices penile/vaginal penetrative sex and oral sex. Patient reports using condoms always. Patient indicates a history of Chlamydia in 2022. He reports last sex was 07/03/23 (1 month ago). 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 He indicates having this visualized under [place] and being advised it was a benign finding.    STI screening history: Last HIV test per patient/review of record was  Lab Results  Component Value Date   HMHIVSCREEN Negative - Validated 07/27/2020    Lab Results  Component Value Date   HIV Non Reactive 04/27/2023    Last HEPC test per patient/review of record was No results found for: "HMHEPCSCREEN" No components found for: "HEPC"   Last HEPB test per patient/review of record was No components found for: "HMHEPBSCREEN"   Fertility: Does the patient or their partner desires a pregnancy in the next year? No  Screening for MPX risk: Does the patient have an unexplained rash? No Is the patient MSM? No Does the patient endorse multiple sex partners or anonymous sex partners? No Did the patient have close or sexual contact with a person diagnosed  with MPX? No Has the patient traveled outside the [place] where MPX is endemic? No Is there a high clinical suspicion for MPX-- evidenced by one of the following No  -Unlikely to be chickenpox  -Lymphadenopathy  -Rash that present in same phase of evolution on any given body part   See flowsheet for further details and programmatic requirements.    There is no immunization history on file for this patient.   The following portions of the patient's history were reviewed and updated as appropriate: allergies, current medications, past medical history, past social history, past surgical history and problem list.  Objective:  There were no vitals filed for this visit.  Physical Exam Nursing note reviewed. Chaperone present: Declined.  Constitutional:      Appearance: Normal appearance.  HENT:     Head: Normocephalic.     Salivary Glands: Right salivary gland is not diffusely enlarged or tender. Left salivary gland is not diffusely enlarged or tender.     Mouth/Throat:     Lips: Pink. No lesions.     Mouth: Mucous membranes are moist. No oral lesions.     Tongue: No lesions. Tongue does not deviate from midline.     Pharynx: Oropharynx is clear. Uvula midline. No oropharyngeal exudate or posterior oropharyngeal erythema.     Tonsils: No tonsillar exudate.  Eyes:     General:        Right eye: No discharge.        Left eye: No discharge.     Conjunctiva/sclera:  Right eye: Right conjunctiva is not injected. No exudate.    Left eye: Left conjunctiva is not injected. No exudate. Pulmonary:     Effort: Pulmonary effort is normal.  Genitourinary:    Pubic Area: No rash or pubic lice.      Penis: Normal. No tenderness, discharge, swelling or lesions.      Testes:        Left: Mass present.     Epididymis:     Right: Normal. No mass or tenderness.     Left: Normal. No mass or tenderness.     Tanner stage (genital): 5.       Comments: Small hard nodule present to left testicle.   Lymphadenopathy:     Head:     Right side of head: No submental, submandibular, tonsillar, preauricular or posterior auricular adenopathy.     Left side of head: No submental, submandibular, tonsillar, preauricular or posterior auricular adenopathy.     Cervical: No cervical adenopathy.     Right cervical: No superficial or posterior cervical adenopathy.    Left cervical: No superficial or posterior cervical adenopathy.     Upper Body:     Right upper body: No supraclavicular or axillary adenopathy.     Left upper body: No supraclavicular or axillary adenopathy.     Lower Body: No right inguinal adenopathy. No left inguinal adenopathy.  Skin:    General: Skin is warm and dry.     Findings: No lesion or rash.     Comments: Skin tone appropriate for ethnicity.   Neurological:     Mental Status: He is alert and oriented to person, place, and time.  Psychiatric:        Attention and Perception: Attention and perception normal.        Mood and Affect: Mood and affect normal.        Speech: Speech normal.        Behavior: Behavior normal. Behavior is cooperative.        Thought Content: Thought content normal.     Assessment and Plan:  Paul Bridges is a 23 y.o. male presenting to the [facility] Department for STI screening  1. Screening for venereal disease (Primary) Gram stain performed per patient request. Negative in office today.  - HBV Antigen/Antibody State Lab - HIV/HCV [facility] - Syphilis Serology, [facility] - Chlamydia/GC NAA, Confirmation - Gram stain  Patient does not have STI symptoms Patient accepted all screenings including  urine GC/Chlamydia, and blood work for HIV/Syphilis. Patient meets criteria for HepB screening? Yes. Ordered? yes Patient meets criteria for HepC screening? Yes. Ordered? yes Recommended condom use with all sex Discussed importance of condom use for STI prevention  Treat positive test results per standing  order. Discussed time line for State Lab results and that patient will be called with positive results and encouraged patient to call if he had not heard in 2 weeks Recommended repeat testing in 3 months with positive results. Recommended returning for continued or worsening symptoms.   Return if symptoms worsen or fail to improve.  No future appointments.  Total time with patient 20 minutes.  Edmonia James, NP

## 2023-07-31 ENCOUNTER — Ambulatory Visit: Payer: Self-pay

## 2023-08-02 LAB — CHLAMYDIA/GC NAA, CONFIRMATION
Chlamydia trachomatis, NAA: NEGATIVE
Neisseria gonorrhoeae, NAA: NEGATIVE

## 2023-08-04 LAB — GONOCOCCUS CULTURE

## 2023-08-18 ENCOUNTER — Telehealth: Payer: Self-pay

## 2023-08-18 ENCOUNTER — Other Ambulatory Visit (HOSPITAL_COMMUNITY): Payer: Self-pay

## 2023-08-18 NOTE — Telephone Encounter (Signed)
 [facility] Patient Advocate Encounter ? ?Insurance verification completed.   ? ?The patient is uninsured and will need patient assistance for medication. ? ?We can complete the application and will need to meet with the patient for signatures and income documentation. ? ?Clearance Coots, CPhT ?Specialty Pharmacy Patient Advocate ?[facility] ?Phone: [phone] ?Fax:  [phone]  ?

## 2023-08-19 ENCOUNTER — Encounter: Payer: Self-pay | Admitting: Nurse Practitioner

## 2023-08-19 ENCOUNTER — Ambulatory Visit: Payer: Self-pay | Admitting: Nurse Practitioner

## 2023-08-19 DIAGNOSIS — N341 Nonspecific urethritis: Secondary | ICD-10-CM

## 2023-08-19 DIAGNOSIS — Z113 Encounter for screening for infections with a predominantly sexual mode of transmission: Secondary | ICD-10-CM

## 2023-08-19 LAB — GRAM STAIN

## 2023-08-19 MED ORDER — DOXYCYCLINE HYCLATE 100 MG PO TABS: 100.0000 mg | ORAL_TABLET | Freq: Two times a day (BID) | ORAL | Status: AC

## 2023-08-19 NOTE — Progress Notes (Signed)
 Pt is here for STD screening. The patient was dispensed Doxycycline 100 mg 2x/day for 7 days today. I provided counseling today regarding the medication. We discussed the medication, the side effects and when to call clinic. Patient given the opportunity to ask questions for any clarification. Condoms, brochure and contact card given. Sonda Primes, RN.

## 2023-08-19 NOTE — Progress Notes (Unsigned)
   HPI: Paul Bridges is a 23 y.o. male who presents to the [facility] clinic today for STI testing.  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   BACLOFEN (LIORESAL) 10 MG TABLET    Take 1 tablet (10 mg total) by mouth 3 (three) times daily.   CHOLECALCIFEROL (VITAMIN D3) 25 MCG (1000 UNIT) TABLET    Take 1,000 Units by mouth daily.   DOXYCYCLINE (MONODOX) 100 MG CAPSULE    Take 2 capsules (200 mg total) by mouth as needed.   DOXYCYCLINE (VIBRA-TABS) 100 MG TABLET    Take 1 tablet (100 mg total) by mouth 2 (two) times daily for 7 days.  Modified Medications   No medications on file  Discontinued Medications   No medications on file    Assessment: Paul Bridges is a 23 yo male presenting for STI testing. He recently presented to the ED for potential exposure to an STI in 04/2023 and received testing that came back negative. He also recently presented to the [facility] health department for STI screening which came back negative. He reported having no symptoms at either visit.   He is eligible for the COVID, HPV, HAV and HBV vaccinations.   Plan: - STI screening: RPR, urine/rectal/pharyngeal GC/CT swabs for cytology today - F/u results to see if treatment is needed

## 2023-08-19 NOTE — Progress Notes (Signed)
 [facility] Department STI clinic [address] [place] [address] Main phone: [phone]  STI screening visit  Subjective:  Paul Bridges is a 23 y.o. male being seen today for an STI screening visit. The patient reports they do have symptoms.    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 male who presents to the office today requesting symptomatic STI testing. He reports 2 male partners in the last 2 months, and practices penile/vaginal penetrative sex. Patient reports using condoms sometimes. Patient indicates a history of Chlamydia in 2022. He reports last sex was 08/08/2023 without a condom.  Patient was seen in this clinic 20 days ago for asymptomatic testing and all tests were negative. Today he is reporting clear discharge that began after last sexual encounter. He denies all other symptoms.    STI screening history: Last HIV test per patient/review of record was  Lab Results  Component Value Date   HMHIVSCREEN Negative - Validated 07/30/2023    Lab Results  Component Value Date   HIV Non Reactive 04/27/2023    Last HEPC test per patient/review of record was No results found for: "HMHEPCSCREEN" No components found for: "HEPC"   Last HEPB test per patient/review of record was No components found for: "HMHEPBSCREEN"   Fertility: Does the patient or their partner desires a pregnancy in the next year? No  Screening for MPX risk: Does the patient have an unexplained rash? No Is the patient MSM? No Does the patient endorse multiple sex partners or anonymous sex partners? Yes Did the patient  have close or sexual contact with a person diagnosed with MPX? No Has the patient traveled outside the [place]  where MPX is endemic? No Is there a high clinical suspicion for MPX-- evidenced by one of the following No  -Unlikely to be chickenpox  -Lymphadenopathy  -Rash that present in same phase of evolution on any given body part   See flowsheet for further details and programmatic requirements.   Immunization History  Administered Date(s) Administered   Tdap 12/03/2017     The following portions of the patient's history were reviewed and updated as appropriate: allergies, current medications, past medical history, past social history, past surgical history and problem list.  Objective:  There were no vitals filed for this visit.  Physical Exam Nursing note reviewed. Chaperone present: Declined.  Constitutional:      Appearance: Normal appearance.  HENT:     Head: Normocephalic.     Salivary Glands: Right salivary gland is not diffusely enlarged or tender. Left salivary gland is not diffusely enlarged or tender.     Mouth/Throat:     Lips: Pink. No lesions.     Mouth: Mucous membranes are moist. No oral lesions.     Tongue: No lesions. Tongue does not deviate from midline.     Pharynx: Oropharynx is clear. Uvula midline. No oropharyngeal exudate or posterior oropharyngeal erythema.     Tonsils: No tonsillar exudate.  Eyes:     General:        Right eye: No discharge.        Left eye: No discharge.  Conjunctiva/sclera:     Right eye: Right conjunctiva is not injected. No exudate.    Left eye: Left conjunctiva is not injected. No exudate. Pulmonary:     Effort: Pulmonary effort is normal.  Genitourinary:    Pubic Area: No rash or pubic lice.      Penis: Circumcised. Discharge present. No tenderness, swelling or lesions.      Testes: Normal.     Epididymis:     Right: Normal. No mass or tenderness.     Left: Normal. No mass or tenderness.     Tanner stage (genital): 5.      Comments: Minimal amount of clear penile discharge present to urethral opening. Penile gram stain collected.  Lymphadenopathy:     Head:     Right side of head: No submental, submandibular, tonsillar, preauricular or posterior auricular adenopathy.     Left side of head: No submental, submandibular, tonsillar, preauricular or posterior auricular adenopathy.     Cervical: No cervical adenopathy.     Right cervical: No superficial or posterior cervical adenopathy.    Left cervical: No superficial or posterior cervical adenopathy.     Upper Body:     Right upper body: No supraclavicular or axillary adenopathy.     Left upper body: No supraclavicular or axillary adenopathy.     Lower Body: Right inguinal adenopathy present. Left inguinal adenopathy present.  Skin:    General: Skin is warm and dry.     Findings: No lesion or rash.     Comments: Skin tone appropriate for ethnicity.   Neurological:     Mental Status: He is alert and oriented to person, place, and time.  Psychiatric:        Attention and Perception: Attention and perception normal.        Mood and Affect: Mood and affect normal.        Speech: Speech normal.        Behavior: Behavior normal. Behavior is cooperative.        Thought Content: Thought content normal.     Assessment and Plan:  Paul Bridges is a 23 y.o. male presenting to the [facility] Department for STI screening  1. Screening for venereal disease (Primary) - Gram stain - Chlamydia/GC NAA, Confirmation  2. NGU (nongonococcal urethritis) Positive for >2WBC/hpf on penile gram stain. Treating per CDC guidelines as NGU.  - doxycycline (VIBRA-TABS) 100 MG tablet; Take 1 tablet (100 mg total) by mouth 2 (two) times daily for 7 days.  Patient does have STI symptoms Patient accepted some screenings including  urine GC/Chlamydia and penile gram stain.  Patient meets criteria for HepB screening? Yes. Ordered? No-Negative tests 3 weeks ago.   Patient meets criteria for HepC screening? Yes. Ordered? No-Negative tests 3 weeks ago.  Recommended condom use with all sex Discussed importance of condom use for STI prevention  Treat positive test results per standing order. Discussed time line for State Lab results and that patient will be called with positive results and encouraged patient to call if he had not heard in 2 weeks Recommended repeat testing in 3 months with positive results. Recommended returning for continued or worsening symptoms.   Return if symptoms worsen or fail to improve.  Future Appointments  Date Time Provider Department Center  08/20/2023  9:45 AM Jennette Kettle, RPH-CPP [facility]   Total time with patient 20 minutes.   Edmonia James, NP

## 2023-08-20 ENCOUNTER — Encounter: Payer: Self-pay | Admitting: Pharmacist

## 2023-08-20 DIAGNOSIS — Z113 Encounter for screening for infections with a predominantly sexual mode of transmission: Secondary | ICD-10-CM

## 2023-08-21 ENCOUNTER — Telehealth: Payer: Self-pay | Admitting: Family Medicine

## 2023-08-21 LAB — CHLAMYDIA/GC NAA, CONFIRMATION
Chlamydia trachomatis, NAA: NEGATIVE
Neisseria gonorrhoeae, NAA: NEGATIVE

## 2023-08-21 NOTE — Telephone Encounter (Signed)
 Please give me a call back I was given meds for std my results came back negative do I need to continue to take the meds if I am Negative

## 2023-09-02 ENCOUNTER — Ambulatory Visit: Payer: Self-pay | Admitting: Nurse Practitioner

## 2023-09-02 DIAGNOSIS — A599 Trichomoniasis, unspecified: Secondary | ICD-10-CM

## 2023-09-02 DIAGNOSIS — Z113 Encounter for screening for infections with a predominantly sexual mode of transmission: Secondary | ICD-10-CM

## 2023-09-02 LAB — GRAM STAIN

## 2023-09-02 NOTE — Progress Notes (Signed)
 Pt is here for STD screening. The patient was dispensed Metronidazole 2gm once today. I provided counseling today regarding the medication. We discussed the medication, the side effects and when to call clinic. Patient given the opportunity to ask questions for any clarification. Condoms, brochure and contact card given. Austine Lefort, RN

## 2023-09-03 ENCOUNTER — Telehealth: Payer: Self-pay

## 2023-09-03 NOTE — Telephone Encounter (Signed)
 Pt called to schedule STI testing. Per numerous attempts with Pharmacy Pt has previously declined PrEP. I did verify if Pt would be filing with insurance at this visit. He did decline and I did disclose that at this time we are no longer able to provide free testing due declines in the previous attempts to the PrEP program that was offered.   His understanding was "So, if I do not have insurance I cannot be tested?" I did clarfiy, that is not the case we are just unable to provide those free services and will be out of pocket.  He did state that he was never offered PrEP and if he knew that he couldn't, then he would've. I did offer that our [facility] and [facility] was able to complete the testings, for that is the note recommendation that we have is to direct to Health Dept.   Routing Pharm Team for review or possible f/u.

## 2023-09-04 ENCOUNTER — Other Ambulatory Visit (HOSPITAL_COMMUNITY): Payer: Self-pay

## 2023-09-04 LAB — CHLAMYDIA/GC NAA, CONFIRMATION
Chlamydia trachomatis, NAA: NEGATIVE
Neisseria gonorrhoeae, NAA: NEGATIVE

## 2023-09-07 ENCOUNTER — Encounter: Payer: Self-pay | Admitting: Nurse Practitioner

## 2023-09-07 MED ORDER — METRONIDAZOLE 500 MG PO TABS
2000.0000 mg | ORAL_TABLET | Freq: Once | ORAL | Status: AC
Start: 2023-09-07 — End: 2023-09-07

## 2023-09-07 NOTE — Progress Notes (Signed)
 [facility] Department STI clinic [address], [address] [place] [address] Main phone: [phone]  STI screening visit  Subjective:  Paul Bridges is a 23 y.o. male being seen today for an STI screening visit. The patient reports they do have minimal symptoms, but symptoms have greatly improved since last visit. .    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 male who presents to the office today requesting symptomatic STI testing.  Patient indicates symptoms include clear penile discharge that is greatly improving since last visit, but wants to retest.   Recent history of STI testing and treatment is as follows: 07/30/23: Asymptomatic, gram stain negative, urine GC/chlamydia negative 08/19/23: Symptomatic with penile discharge. Gram stain positive for >2WBC/hpf. Urine GC/Chlamydia negative. Treated with Doxy as NGU. 09/02/23: Symptomatic with penile discharge that has improved since last visit with Doxy treatment. Gram stain positive for >2WBC/hpf. Urine GC/Chlamydia negative. Patient reports he did take the full prescription of medication as instructed.   He reports 2 male partners in the last 2 months, and practices penile/vaginal penetrative sex and oral sex. Patient reports using condoms sometimes. Patient indicates a history of chlamydia in 2022 He reports last sex was 08/08/23 and he has not had sex since his last visit to this clinic.   STI screening history: Last HIV test per patient/review of record was  Lab Results  Component Value Date   HMHIVSCREEN Negative - Validated 07/30/2023    Lab Results  Component Value Date   HIV Non Reactive 04/27/2023    Last HEPC test per patient/review of record was  Lab Results  Component Value Date    HMHEPCSCREEN Negative-Validated 07/30/2023   No components found for: "HEPC"   Last HEPB test per patient/review of record was 07/29/24 and all negative. No immunity, no current or prior infection indicated.   Fertility: Does the patient or their partner desires a pregnancy in the next year? No  Screening for MPX risk: Does the patient have an unexplained rash? No Is the patient MSM? No Does the patient endorse multiple sex partners or anonymous sex partners? Yes Did the patient have close or sexual contact with a person diagnosed with MPX? No Has the patient traveled outside the [place]  where MPX is endemic? No Is there a high clinical suspicion for MPX-- evidenced by one of the following No  -Unlikely to be chickenpox  -Lymphadenopathy  -Rash that present in same phase of evolution on any given body part   See flowsheet for further details and programmatic requirements.   Immunization History  Administered Date(s) Administered   Tdap 12/03/2017     The following portions of the patient's history were reviewed and updated as appropriate: allergies, current medications, past medical history, past social history, past surgical history and problem list.  Objective:  There were no vitals filed for this visit.  Physical Exam Nursing note reviewed. Chaperone present: Declined.  Constitutional:      Appearance: Normal appearance.  HENT:     Head: Normocephalic.     Salivary Glands: Right salivary gland is not diffusely enlarged or tender. Left salivary gland is not diffusely enlarged or tender.     Mouth/Throat:     Lips: Pink. No lesions.     Mouth: Mucous membranes are  moist. No oral lesions.     Tongue: No lesions. Tongue does not deviate from midline.     Pharynx: Oropharynx is clear. Uvula midline. No oropharyngeal exudate or posterior oropharyngeal erythema.     Tonsils: No tonsillar exudate.  Eyes:     General:        Right eye: No discharge.        Left eye: No discharge.      Conjunctiva/sclera:     Right eye: Right conjunctiva is not injected. No exudate.    Left eye: Left conjunctiva is not injected. No exudate. Pulmonary:     Effort: Pulmonary effort is normal.  Genitourinary:    Pubic Area: No rash or pubic lice.      Penis: Discharge present. No tenderness, swelling or lesions.      Testes:        Left: Mass present.     Epididymis:     Right: Normal. No mass or tenderness.     Left: Normal. No mass or tenderness.     Tanner stage (genital): 5.     Comments: Known mass to left testicle. Has had US  imaging and is followed by outside provider.  Minimal amount of clear, thin, non-odorous discharge from penile head/urethral opening on exam.  Lymphadenopathy:     Head:     Right side of head: No submental, submandibular, tonsillar, preauricular or posterior auricular adenopathy.     Left side of head: No submental, submandibular, tonsillar, preauricular or posterior auricular adenopathy.     Cervical: No cervical adenopathy.     Right cervical: No superficial or posterior cervical adenopathy.    Left cervical: No superficial or posterior cervical adenopathy.     Upper Body:     Right upper body: No supraclavicular or axillary adenopathy.     Left upper body: No supraclavicular or axillary adenopathy.     Lower Body: No right inguinal adenopathy. No left inguinal adenopathy.  Skin:    General: Skin is warm and dry.     Findings: No lesion or rash.     Comments: Skin tone appropriate for ethnicity.   Neurological:     Mental Status: He is alert and oriented to person, place, and time.  Psychiatric:        Attention and Perception: Attention and perception normal.        Mood and Affect: Mood and affect normal.        Speech: Speech normal.        Behavior: Behavior normal. Behavior is cooperative.        Thought Content: Thought content normal.     Assessment and Plan:  Paul Bridges is a 23 y.o. male presenting to the [facility] Department for STI screening  1. Screening for venereal disease (Primary)  - Gram stain - Chlamydia/GC NAA, Confirmation  2. Trichomoniasis Treating patient presumptively as trichomoniasis since last 3 urine GC/chlamydia tests within 1 month have been negative, yet patient symptoms persist, and last 2 gram stains have been positive for >2WBC/hpf, most recent today following 2 week course of Doxycycline  treatment provided 2 weeks ago.  The patient was dispensed Metronidzole today. I provided counseling today regarding the medication. We discussed the medication, the side effects and when to call clinic. Patient given the opportunity to ask questions. Questions answered.    - metroNIDAZOLE (FLAGYL) 500 MG tablet; Take 4 tablets (2,000 mg total) by mouth once for 1 dose.  Patient does have  STI symptoms Patient accepted all screenings including  urine GC/Chlamydia, and blood work for HIV/Syphilis. Patient meets criteria for HepB screening? Yes. Ordered? No-Recently tested and negative within the last 1 month.  Patient meets criteria for HepC screening? Yes. Ordered? No-Recently tested and negative within the last 1 month.  Recommended condom use with all sex Discussed importance of condom use for STI prevention  Treat positive test results per standing order. Discussed time line for State Lab results and that patient will be called with positive results and encouraged patient to call if he had not heard in 2 weeks Recommended repeat testing in 3 months with positive results. Recommended returning for continued or worsening symptoms.   Return if symptoms worsen or fail to improve.  No future appointments.  Total time with patient 45 minutes.   Merleen Stare, NP

## 2023-09-08 ENCOUNTER — Telehealth: Payer: Self-pay

## 2023-09-08 NOTE — Telephone Encounter (Signed)
 Called Pt to f/u with message that was received from nursing staff. Pt had concerns on wanting to start PrEP. Called, no answer, left a VM, and sent a MyChart msg.   Spoke with Pharm Team - If Pt is showing an interest to participate then he can see Pharm for a further discussion. Offer appt w/ Mylinda Asa to discuss PrEP.   Previously advised we are not longer able to schedule STI testing's under PrEP due to the declining of PrEP services.

## 2023-09-10 ENCOUNTER — Other Ambulatory Visit (HOSPITAL_COMMUNITY): Payer: Self-pay

## 2023-09-11 ENCOUNTER — Other Ambulatory Visit (HOSPITAL_COMMUNITY): Payer: Self-pay

## 2023-09-11 ENCOUNTER — Ambulatory Visit: Payer: Self-pay | Admitting: Family Medicine

## 2023-09-11 DIAGNOSIS — Z113 Encounter for screening for infections with a predominantly sexual mode of transmission: Secondary | ICD-10-CM

## 2023-09-11 LAB — GRAM STAIN

## 2023-09-11 NOTE — Progress Notes (Signed)
 Pt is here for std screening, gram stain performed no treatment required per provider. Caren Channel, RN

## 2023-09-11 NOTE — Progress Notes (Addendum)
 [facility] Department STI clinic [address] [place] [address] Main phone: [phone]  STI screening visit  Subjective:  Paul Bridges is a 23 y.o. male being seen today for an STI screening visit. The patient reports they do not have symptoms.    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 Denies symptoms. Desired gram stain today   STI screening history: Last HIV test per patient/review of record was  Lab Results  Component Value Date   HMHIVSCREEN Negative - Validated 07/30/2023    Lab Results  Component Value Date   HIV Non Reactive 04/27/2023    Last HEPC test per patient/review of record was  Lab Results  Component Value Date   HMHEPCSCREEN Negative-Validated 07/30/2023   No components found for: "HEPC"   Last HEPB test per patient/review of record was No components found for: "HMHEPBSCREEN"   Fertility: Does the patient or their partner desires a pregnancy in the next year? No  Screening for MPX risk: Does the patient have an unexplained rash? No Is the patient MSM? No Does the patient endorse multiple sex partners or anonymous sex partners? No Did the patient have close or sexual contact with a person diagnosed with MPX? No Has the patient traveled outside the [place]  where MPX is endemic? No Is there a high clinical suspicion for MPX-- evidenced by one of the following No  -Unlikely to be chickenpox  -Lymphadenopathy  -Rash that present in same phase of evolution on any given body part  See flowsheet for further details and programmatic requirements  Hyperlink available at the top of the signed note in blue.  Flow sheet content below:  Pregnancy Intention Screening Does the patient want to become  pregnant in the next year?: No Does the patient's partner want to become pregnant in the next year?: No Would the patient like to discuss contraceptive options today?: No Reason For STD Screen STD Screening: Is asymptomatic Have you ever had an STD?: Yes History of Antibiotic use in the past 2 weeks?: Yes STD Symptoms Denies all: Yes Risk Factors for Hep B Household, sexual, or needle sharing contact of a person infected with Hep B: No Sexual contact with a person who uses drugs not as prescribed?: No Currently or Ever used drugs not as prescribed: No HIV Positive: No PRep Patient: No Men who have sex with men: No Have Hepatitis C: No History of Incarceration: No History of Homeslessness?: No Anal sex following anal drug use?: No Risk Factors for Hep C Currently using drugs not as prescribed: No Sexual partner(s) currently using drugs as not prescribed: No History of drug use: No HIV Positive: No People with a history of incarceration: No People born between the years of 31 and 24: No Abuse History Has patient ever been abused physically?: No Has patient ever been abused sexually?: No Does patient feel they have a problem with Anxiety?: No Does patient feel they have a problem with Depression?: No Counseling Patient counseled to use condoms with all sex: Condoms declined RTC in 2-3 weeks for test results: Yes Clinic will call if test results abnormal before test result appt.: Yes Test results given to patient Patient counseled to use condoms with all sex: Condoms declined  Immunization History  Administered Date(s) Administered   Tdap 12/03/2017    The following portions of the patient's history were reviewed and updated as appropriate: allergies, current medications, past medical history, past social history, past surgical history and problem list.  Objective:  There were no vitals filed for this visit.  Physical Exam Exam conducted with a chaperone present  Melvern Stack).  Constitutional:      Appearance: Normal appearance.  HENT:     Head: Normocephalic and atraumatic.     Comments: No nits or hair loss    Mouth/Throat:     Mouth: Mucous membranes are moist. No oral lesions.     Pharynx: Oropharynx is clear. No oropharyngeal exudate or posterior oropharyngeal erythema.  Eyes:     General:        Right eye: No discharge.        Left eye: No discharge.     Conjunctiva/sclera:     Right eye: Right conjunctiva is not injected. No exudate.    Left eye: Left conjunctiva is not injected. No exudate. Pulmonary:     Effort: Pulmonary effort is normal.  Abdominal:     General: Abdomen is flat.     Palpations: Abdomen is soft. There is no hepatomegaly or mass.     Tenderness: There is no abdominal tenderness. There is no rebound.     Hernia: There is no hernia in the left inguinal area or right inguinal area.  Genitourinary:    Pubic Area: No rash or pubic lice (no nits).      Penis: Normal. No tenderness, discharge, swelling or lesions.      Testes: Normal.     Epididymis:     Right: Normal. No mass or tenderness.     Left: Normal. No mass or tenderness.     Rectum: Normal. No tenderness (no lesions or discharge).     Comments: Penile Discharge Amount: none Color:  none Lymphadenopathy:     Head:     Right side of head: No preauricular or posterior auricular adenopathy.     Left side of head: No preauricular or posterior auricular adenopathy.     Cervical: No cervical adenopathy.     Upper Body:     Right upper body: No supraclavicular, axillary or epitrochlear adenopathy.     Left upper body: No supraclavicular, axillary or epitrochlear adenopathy.     Lower Body: No right inguinal adenopathy. No left inguinal adenopathy.  Skin:    General: Skin is warm and dry.     Findings: No lesion or rash.  Neurological:     Mental Status: He is alert and oriented to person, place, and time.     Assessment and Plan:  Paul Bridges is a 23 y.o. male presenting to the [facility] Department for STI screening  1. Screening for venereal disease (Primary) -gram stain remains with > 2 WBC -patient just finished medication for trich about 1-2 days ago -gram stain done today per patient request- discussed with Dr. Tanis Fan  -will not treat today as this is likely not related to STI and patient has been adequately treated at this point   - Chlamydia/GC NAA, Confirmation - Gram stain   Patient does not have STI symptoms Patient accepted all screenings including  urine GC/Chlamydia, and blood work for HIV/Syphilis. Patient meets criteria for HepB screening? No. Ordered? not applicable Patient meets criteria for HepC screening? No. Ordered? not applicable Recommended condom use with all sex Discussed importance of  condom use for STI prevention  Treat positive test results per standing order. Discussed time line for State Lab results and that patient will be called with positive results and encouraged patient to call if he had not heard in 2 weeks Recommended repeat testing in 3 months with positive results. Recommended returning for continued or worsening symptoms.   Return if symptoms worsen or fail to improve, for STI screening.  Future Appointments  Date Time Provider Department Center  09/17/2023 11:15 AM Sonya Duster, RPH-CPP [facility]    Earleen Glazier, [place]

## 2023-09-15 DIAGNOSIS — Z419 Encounter for procedure for purposes other than remedying health state, unspecified: Secondary | ICD-10-CM | POA: Diagnosis not present

## 2023-09-15 LAB — CHLAMYDIA/GC NAA, CONFIRMATION
Chlamydia trachomatis, NAA: NEGATIVE
Neisseria gonorrhoeae, NAA: NEGATIVE

## 2023-09-16 NOTE — Progress Notes (Unsigned)
 HPI: Paul Bridges is a 23 y.o. male who presents to the [facility] pharmacy clinic to discuss and initiate PrEP.  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 already established Urgent care  Gender at birth Male Male  Gender identity cis-Male cis-Male  Risk for HIV Hx of STI;Condomless vaginal or anal intercourse Condomless vaginal or anal intercourse  Sex Partners Women only Women only  # sex partners past 3-6 mos 1 5  Sex activity preferences Insertive;Oral Insertive;Oral  Condom use Yes Yes  % condom use  60  Treated for STI? Yes No  HIV symptoms? None None  PrEP Eligibility Yes   Paper work received? No     Labs:  SCr: No results found for: "CREATININE" HIV Lab Results  Component Value Date   HIV Non Reactive 04/27/2023   HIV NON-REACTIVE 08/06/2022   HIV NON-REACTIVE 01/24/2022   HIV NON-REACTIVE 10/02/2021   HIV NON-REACTIVE 10/29/2018   Hepatitis B Lab Results  Component Value Date   HEPBSAB NON-REACTIVE 08/06/2022   HEPBSAG  07/30/2023    Surface Antigen- Non Reactive; Surface Antibody- Non Reactive; Total Core Antibody- Non Reactive   Hepatitis C Lab Results  Component Value Date   HEPCAB NON-REACTIVE 08/06/2022   Hepatitis A Lab Results  Component Value Date   HAV NON-REACTIVE 08/06/2022   RPR and STI Lab Results  Component Value Date   LABRPR NON REACTIVE 04/27/2023   LABRPR NON-REACTIVE 08/06/2022   LABRPR NON-REACTIVE 01/24/2022   LABRPR NON-REACTIVE 10/02/2021   LABRPR NON-REACTIVE 10/29/2018    STI Results GC CT  04/27/2023  7:19 PM Negative  Negative   11/01/2022  9:44 PM Negative  Negative   08/06/2022  2:24 PM Negative    Negative  Negative    Negative   06/11/2022   2:39 PM Negative  Negative   06/11/2022  2:38 PM Negative  Negative   01/24/2022 10:25 AM Negative  Negative   01/24/2022 10:24 AM Negative  Negative   11/14/2021  2:21 PM Negative    Negative  Negative    Negative   10/15/2021  2:51 PM Negative  Negative   10/02/2021  3:01 PM Negative    Negative  Negative    Negative   05/25/2021  2:14 PM Negative  Negative     Assessment: Paul presents to clinic today to discuss initiation of PrEP. He has been seen at [facility] multiple times in the past for STI testing and previously declined PrEP. Patient has a history of chlamydia in 2022. More recently he has been seen at the [facility] Department for STI testing and treatment of nongonococcal urethritis and presumptive trichomoniasis in April. Most recent GC/CT NAAT was negative on 09/11/23, and he reports no new sexual encounters since then but requests STI testing today. Last HIV test was negative on 07/30/23. Today patient reports one male partner in the past 3 months (insertive vaginal and oral sex). He denies current STI symptoms but endorses testicular pain that comes and goes starting about 2 weeks ago. Encouraged him to follow up with PCP for this.   Discussed PrEP options today, and he  is interested in Descovy. Patient is currently uninsured but has been thinking about trying to enroll in [place]. Encouraged him to visit the Medicaid enrollment office on the 4th floor of [facility], and he said he would do so as soon as possible. Will check baseline CMP, lipid panel, HIV antibody, RPR, urine/oral cytology today. Provided patient with 2 weeks of Descovy samples while we are awaiting Medicaid enrollment. Counseled him not to start taking Descovy until we call him and instruct him to do so pending HIV antibody test today and he expressed understanding.  Counseled patient that Descovy is a one pill once daily regimen with or without food that can prevent HIV. Discussed the importance of taking the  medication daily to provide protection and decreased adherence is associated with decreased efficacy. Also discussed how Descovy works to prevent HIV but not other STDs and encouraged the use of condoms. Counseled on what to do if dose is missed, if closer to missed dose take immediately, if closer to next dose then skip and resume normal schedule. Counseled patient that Descovy is normally well tolerated, however some patients experience a "start up syndrome" with nausea, diarrhea, dizziness, and fatigue but that those should resolve soon after starting.  Advised that any nausea can be mitigated by taking it with food. I reviewed patient medications and found no drug interactions. Discussed how our PrEP process works here at the clinic including follow ups and lab monitoring every 3 months.   Appears eligible for hepatitis B, hepatitis A, and HPV vaccines. Will discuss at next visit.  Plan: - Check HIV antibody, urine/oral cytology, RPR, CMP, and lipid panel today - Start Descovy if HIV antibody negative - Follow up on Medicaid enrollment - Follow up with Cassie on 12/15/23  Georga Killings, PharmD PGY-1 Pharmacy Resident

## 2023-09-17 ENCOUNTER — Other Ambulatory Visit: Payer: Self-pay

## 2023-09-17 ENCOUNTER — Ambulatory Visit (INDEPENDENT_AMBULATORY_CARE_PROVIDER_SITE_OTHER): Payer: Self-pay | Admitting: Pharmacist

## 2023-09-17 DIAGNOSIS — Z114 Encounter for screening for human immunodeficiency virus [HIV]: Secondary | ICD-10-CM | POA: Diagnosis not present

## 2023-09-17 DIAGNOSIS — Z113 Encounter for screening for infections with a predominantly sexual mode of transmission: Secondary | ICD-10-CM

## 2023-09-17 DIAGNOSIS — Z2981 Encounter for HIV pre-exposure prophylaxis: Secondary | ICD-10-CM | POA: Diagnosis not present

## 2023-09-17 DIAGNOSIS — Z7189 Other specified counseling: Secondary | ICD-10-CM

## 2023-09-17 MED ORDER — DESCOVY 200-25 MG PO TABS
1.0000 | ORAL_TABLET | Freq: Every day | ORAL | Status: AC
Start: 2023-09-17 — End: 2023-10-01

## 2023-09-17 NOTE — Progress Notes (Signed)
 NEW REFERRAL TO CPP CLINIC  Medication Samples have been provided to the patient.  Drug name: Descovy     Strength: 200/25 mg    Qty: 14 tablets (2 bottles) LOT: [id number]   Exp.Date: 3/27  Samples requested by Nicklas Barns, PharmD.  Dosing instructions: Take one tablet by mouth once daily.   The patient has been instructed regarding the correct time, dose, and frequency of taking this medication, including desired effects and most common side effects.   Nicklas Barns, PharmD, CPP, BCIDP, AAHIVP Clinical Pharmacist Practitioner Infectious Diseases Clinical Pharmacist [facility]

## 2023-09-18 LAB — C. TRACHOMATIS/N. GONORRHOEAE RNA
C. trachomatis RNA, TMA: NOT DETECTED
N. gonorrhoeae RNA, TMA: NOT DETECTED

## 2023-09-18 LAB — LIPID PANEL
Cholesterol: 159 mg/dL (ref <200)
HDL: 44 mg/dL (ref >=40)
LDL Cholesterol (Calc): 101 mg/dL — ABNORMAL HIGH
Non-HDL Cholesterol (Calc): 115 mg/dL (ref <130)
Total CHOL/HDL Ratio: 3.6 (calc) (ref <5.0)
Triglycerides: 56 mg/dL (ref <150)

## 2023-09-18 LAB — RPR: RPR Ser Ql: NONREACTIVE

## 2023-09-18 LAB — COMPREHENSIVE METABOLIC PANEL WITH GFR
AG Ratio: 1.6 (calc) (ref 1.0–2.5)
ALT: 19 U/L (ref 9–46)
AST: 20 U/L (ref 10–40)
Albumin: 4.4 g/dL (ref 3.6–5.1)
Alkaline phosphatase (APISO): 56 U/L (ref 36–130)
BUN/Creatinine Ratio: 9 (calc) (ref 6–22)
BUN: 13 mg/dL (ref 7–25)
CO2: 27 mmol/L (ref 20–32)
Calcium: 9.2 mg/dL (ref 8.6–10.3)
Chloride: 105 mmol/L (ref 98–110)
Creat: 1.45 mg/dL — ABNORMAL HIGH (ref 0.60–1.24)
Globulin: 2.8 g/dL (ref 1.9–3.7)
Glucose, Bld: 99 mg/dL (ref 65–99)
Potassium: 4 mmol/L (ref 3.5–5.3)
Sodium: 136 mmol/L (ref 135–146)
Total Bilirubin: 1.5 mg/dL — ABNORMAL HIGH (ref 0.2–1.2)
Total Protein: 7.2 g/dL (ref 6.1–8.1)
eGFR: 69 mL/min/{1.73_m2} (ref >=60)

## 2023-09-18 LAB — HIV ANTIBODY (ROUTINE TESTING W REFLEX): HIV 1&2 Ab, 4th Generation: NONREACTIVE

## 2023-09-18 LAB — GC/CHLAMYDIA PROBE, AMP (THROAT)
Chlamydia trachomatis RNA: NOT DETECTED
Neisseria gonorrhoeae RNA: NOT DETECTED

## 2023-09-19 ENCOUNTER — Other Ambulatory Visit (HOSPITAL_COMMUNITY): Payer: Self-pay

## 2023-09-22 ENCOUNTER — Other Ambulatory Visit (HOSPITAL_COMMUNITY): Payer: Self-pay

## 2023-09-22 ENCOUNTER — Other Ambulatory Visit: Payer: Self-pay | Admitting: Pharmacist

## 2023-09-22 DIAGNOSIS — Z2981 Encounter for HIV pre-exposure prophylaxis: Secondary | ICD-10-CM

## 2023-09-22 MED ORDER — DESCOVY 200-25 MG PO TABS
1.0000 | ORAL_TABLET | Freq: Every day | ORAL | 2 refills | Status: DC
Start: 2023-09-22 — End: 2023-12-17

## 2023-10-01 ENCOUNTER — Ambulatory Visit: Admitting: Primary Care

## 2023-10-03 ENCOUNTER — Encounter: Payer: Self-pay | Admitting: Primary Care

## 2023-10-03 ENCOUNTER — Ambulatory Visit: Payer: Self-pay | Admitting: Primary Care

## 2023-10-03 ENCOUNTER — Ambulatory Visit (INDEPENDENT_AMBULATORY_CARE_PROVIDER_SITE_OTHER): Admitting: Primary Care

## 2023-10-03 ENCOUNTER — Other Ambulatory Visit: Payer: Self-pay | Admitting: Primary Care

## 2023-10-03 VITALS — BP 128/84 | HR 53 | Temp 97.3°F | Ht 74.0 in | Wt 254.0 lb

## 2023-10-03 DIAGNOSIS — R7989 Other specified abnormal findings of blood chemistry: Secondary | ICD-10-CM

## 2023-10-03 DIAGNOSIS — R194 Change in bowel habit: Secondary | ICD-10-CM | POA: Diagnosis not present

## 2023-10-03 LAB — CBC WITH DIFFERENTIAL/PLATELET
Basophils Absolute: 0 10*3/uL (ref 0.0–0.1)
Basophils Relative: 0.7 % (ref 0.0–3.0)
Eosinophils Absolute: 0.2 10*3/uL (ref 0.0–0.7)
Eosinophils Relative: 3.4 % (ref 0.0–5.0)
HCT: 48.4 % (ref 39.0–52.0)
Hemoglobin: 16.4 g/dL (ref 13.0–17.0)
Lymphocytes Relative: 29.8 % (ref 12.0–46.0)
Lymphs Abs: 1.5 10*3/uL (ref 0.7–4.0)
MCHC: 33.9 g/dL (ref 30.0–36.0)
MCV: 85.4 fl (ref 78.0–100.0)
Monocytes Absolute: 0.6 10*3/uL (ref 0.1–1.0)
Monocytes Relative: 11.3 % (ref 3.0–12.0)
Neutro Abs: 2.7 10*3/uL (ref 1.4–7.7)
Neutrophils Relative %: 54.8 % (ref 43.0–77.0)
Platelets: 172 10*3/uL (ref 150.0–400.0)
RBC: 5.66 Mil/uL (ref 4.22–5.81)
RDW: 13.1 % (ref 11.5–15.5)
WBC: 4.9 10*3/uL (ref 4.0–10.5)

## 2023-10-03 LAB — COMPREHENSIVE METABOLIC PANEL WITH GFR
ALT: 21 U/L (ref 0–53)
AST: 20 U/L (ref 0–37)
Albumin: 4.5 g/dL (ref 3.5–5.2)
Alkaline Phosphatase: 56 U/L (ref 39–117)
BUN: 18 mg/dL (ref 6–23)
CO2: 27 meq/L (ref 19–32)
Calcium: 9.4 mg/dL (ref 8.4–10.5)
Chloride: 104 meq/L (ref 96–112)
Creatinine, Ser: 1.45 mg/dL (ref 0.40–1.50)
GFR: 67.16 mL/min (ref >60.00)
Glucose, Bld: 94 mg/dL (ref 70–99)
Potassium: 4.2 meq/L (ref 3.5–5.1)
Sodium: 139 meq/L (ref 135–145)
Total Bilirubin: 1.2 mg/dL (ref 0.2–1.2)
Total Protein: 7.2 g/dL (ref 6.0–8.3)

## 2023-10-03 LAB — TSH: TSH: 1.94 u[IU]/mL (ref 0.35–5.50)

## 2023-10-03 LAB — C-REACTIVE PROTEIN: CRP: <1 mg/dL (ref 0.5–20.0)

## 2023-10-03 LAB — SEDIMENTATION RATE: Sed Rate: 3 mm/h (ref 0–15)

## 2023-10-03 NOTE — Addendum Note (Signed)
 Addended by: Gerry Krone on: 10/03/2023 11:35 AM   Modules accepted: Orders

## 2023-10-03 NOTE — Assessment & Plan Note (Signed)
 Reviewed labs from May 2025 from infectious disease.  Repeat creatinine levels pending. Encouraged to avoid NSAIDs and continue regular water consumption.

## 2023-10-03 NOTE — Progress Notes (Signed)
 Subjective:    Patient ID: Paul Bridges, male    DOB: 2000/11/12, 23 y.o.   MRN: [id number]  HPI  Paul Bridges is a very pleasant 23 y.o. male with a history of high risk sexual behavior, nongonococcal urethritis who presents today to reestablish care and discuss the issues below.  1) High Risk Sexual Behavior: Currently following with infectious disease, last visit was 09/17/2023.  During this visit labs were collected including lipid panel, CMP, STI testing.  Also frequently seen at the [facility] department for STI testing.  Currently managed on Descovy  for HIV prevention.  No recent positive STIs but was recently treated for a nongonococcal urethritis.  He has yet to start Descovy  as he doesn't want to. He is sexually active now, same partner for the last 3 months. No condom use.   2) Elevated Creatinine: Noted on labs from May 2025 per infectious disease with creatinine 1.45.   He drinks at least 1/2 to 1 gallon per day. He took creatine for 2 months, stopped around March 2025. He denies recurrent NSAID use. He has a family history of CKD in maternal grandmother, previously on dialysis, had recent kidney transplant.   He denies difficulty urinating.   3) Increased Bowel movements: Chronic since November 2024. Occurs each time he eats dinner or a snack he will have a bowel movement within 30 minutes. Stools are formed. He denies   At the time of symptom onset he began a carnivore diet, stopped this diet in late November 2024. He denies changes in his diet, bloody stools, stools with liquids, nausea, vomiting.   He does notice intermittent abdominal pain, unrelated to eating or drinking, occurs every few days, lasts 30 seconds, is sharp. These symptoms began 2 weeks ago. Last episode of pain was 2 days ago while laying.    Review of Systems  Constitutional:  Negative for fever.  HENT:  Positive for congestion.   Respiratory:  Negative for cough.   Gastrointestinal:   Positive for abdominal pain. Negative for blood in stool, constipation, diarrhea, nausea and vomiting.  Genitourinary:  Negative for dysuria and penile discharge.  Allergic/Immunologic: Positive for environmental allergies.         Past Medical History:  Diagnosis Date   Chronic tonsillitis    H/O testicular mass    Sore throat 01/24/2022    Social History   Socioeconomic History   Marital status: Single    Spouse name: Not on file   Number of children: Not on file   Years of education: Not on file   Highest education level: Not on file  Occupational History   Not on file  Tobacco Use   Smoking status: Never   Smokeless tobacco: Never  Vaping Use   Vaping status: Never Used  Substance and Sexual Activity   Alcohol use: Yes    Alcohol/week: 1.0 standard drink of alcohol    Types: 1 Glasses of wine per week   Drug use: Not Currently    Types: Marijuana    Comment: once every couple months   Sexual activity: Yes    Partners: Female    Birth control/protection: Condom    Comment: condoms sometimes  Other Topics Concern   Not on file  Social History Narrative   Single.   No children.   Works in Clinical biochemist.   Student at [employer]   Social Drivers of Health   Financial Resource Strain: Not on file  Food Insecurity: Not  on file  Transportation Needs: Not on file  Physical Activity: Not on file  Stress: Not on file  Social Connections: Not on file  Intimate Partner Violence: Not on file    History reviewed. No pertinent surgical history.  Family History  Problem Relation Age of Onset   Diabetes Mother    Hyperlipidemia Mother    Hypertension Mother     Allergies  Allergen Reactions   Shellfish Allergy     Current Outpatient Medications on File Prior to Visit  Medication Sig Dispense Refill   emtricitabine-tenofovir AF (DESCOVY ) 200-25 MG tablet Take 1 tablet by mouth daily. (Patient not taking: Reported on 10/03/2023) 30 tablet 2    No current facility-administered medications on file prior to visit.    BP 128/84   Pulse (!) 53   Temp (!) 97.3 F (36.3 C) (Temporal)   Ht 6\' 2"  (1.88 m)   Wt 254 lb (115.2 kg)   SpO2 99%   BMI 32.61 kg/m  Objective:   Physical Exam Cardiovascular:     Rate and Rhythm: Normal rate and regular rhythm.  Pulmonary:     Effort: Pulmonary effort is normal.     Breath sounds: Normal breath sounds.  Abdominal:     General: Bowel sounds are normal.     Palpations: Abdomen is soft.     Tenderness: There is no abdominal tenderness.  Musculoskeletal:     Cervical back: Neck supple.  Skin:    General: Skin is warm and dry.  Neurological:     Mental Status: He is alert and oriented to person, place, and time.  Psychiatric:        Mood and Affect: Mood normal.           Assessment & Plan:  Elevated serum creatinine Assessment & Plan: Reviewed labs from May 2025 from infectious disease.  Repeat creatinine levels pending. Encouraged to avoid NSAIDs and continue regular water consumption.  Orders: -     Comprehensive metabolic panel with GFR  Frequent bowel movements Assessment & Plan: Unclear cause, especially given lack of diarrhea.  Checking labs today to evaluate for inflammatory bowel disease, thyroid disease.  No alarm signs in HPI or exam.  We discussed stool bulking agents, foods to avoid.   Orders: -     CBC with Differential/Platelet -     Sedimentation rate -     Calprotectin, Fecal -     C-reactive protein -     TSH        Gabriel John, NP

## 2023-10-03 NOTE — Patient Instructions (Signed)
 Stop by the lab prior to leaving today. I will notify you of your results once received.   You can take cetirizine (Zyrtec) 10 mg at bedtime for allergies.  Nasal Congestion: Try using Flonase (fluticasone) nasal spray. Instill 1 spray in each nostril twice daily.   It was a pleasure to see you today!

## 2023-10-03 NOTE — Assessment & Plan Note (Signed)
 Unclear cause, especially given lack of diarrhea.  Checking labs today to evaluate for inflammatory bowel disease, thyroid disease.  No alarm signs in HPI or exam.  We discussed stool bulking agents, foods to avoid.

## 2023-10-15 ENCOUNTER — Other Ambulatory Visit: Payer: Self-pay

## 2023-10-15 DIAGNOSIS — R194 Change in bowel habit: Secondary | ICD-10-CM

## 2023-10-16 DIAGNOSIS — Z419 Encounter for procedure for purposes other than remedying health state, unspecified: Secondary | ICD-10-CM | POA: Diagnosis not present

## 2023-10-16 LAB — SPECIMEN STATUS REPORT

## 2023-10-16 LAB — CALPROTECTIN, FECAL: Calprotectin, Fecal: 24 ug/g (ref 0–120)

## 2023-11-15 DIAGNOSIS — Z419 Encounter for procedure for purposes other than remedying health state, unspecified: Secondary | ICD-10-CM | POA: Diagnosis not present

## 2023-11-28 ENCOUNTER — Ambulatory Visit: Admitting: Primary Care

## 2023-12-11 NOTE — Progress Notes (Deleted)
   HPI: Paul Bridges is a 23 y.o. male who presents to the [facility] pharmacy clinic for HIV PrEP follow-up.  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  Modified Medications   No medications on file  Discontinued Medications   No medications on file       09/17/2023    1:59 PM 10/02/2021    2:51 PM  [facility] HIV PREP FLOWSHEET RESULTS  Insurance Status Uninsured Insured  How did you hear? already established Urgent care  Gender at birth Male Male  Gender identity cis-Male cis-Male  Risk for HIV Hx of STI;Condomless vaginal or anal intercourse Condomless vaginal or anal intercourse  Sex Partners Women only Women only  # sex partners past 3-6 mos 1 5  Sex activity preferences Insertive;Oral Insertive;Oral  Condom use Yes Yes  % condom use  60  Treated for STI? Yes No  HIV symptoms? None None  PrEP Eligibility Yes   Paper work received? No     Labs:  SCr: Lab Results  Component Value Date   CREATININE 1.45 10/03/2023   CREATININE 1.45 (H) 09/17/2023   HIV Lab Results  Component Value Date   HIV NON-REACTIVE 09/17/2023   HIV Non Reactive 04/27/2023   HIV NON-REACTIVE 08/06/2022   HIV NON-REACTIVE 01/24/2022   HIV NON-REACTIVE 10/02/2021   Hepatitis B Lab Results  Component Value Date   HEPBSAB NON-REACTIVE 08/06/2022   HEPBSAG  07/30/2023    Surface Antigen- Non Reactive; Surface Antibody- Non Reactive; Total Core Antibody- Non Reactive   Hepatitis C Lab Results  Component Value Date   HEPCAB NON-REACTIVE 08/06/2022   Hepatitis A Lab Results  Component Value Date   HAV NON-REACTIVE 08/06/2022   RPR and STI Lab Results  Component  Value Date   LABRPR NON-REACTIVE 09/17/2023   LABRPR NON REACTIVE 04/27/2023   LABRPR NON-REACTIVE 08/06/2022   LABRPR NON-REACTIVE 01/24/2022   LABRPR NON-REACTIVE 10/02/2021    STI Results GC CT  04/27/2023  7:19 PM Negative  Negative   11/01/2022  9:44 PM Negative  Negative   08/06/2022  2:24 PM Negative    Negative  Negative    Negative   06/11/2022  2:39 PM Negative  Negative   06/11/2022  2:38 PM Negative  Negative   01/24/2022 10:25 AM Negative  Negative   01/24/2022 10:24 AM Negative  Negative   11/14/2021  2:21 PM Negative    Negative  Negative    Negative   10/15/2021  2:51 PM Negative  Negative   10/02/2021  3:01 PM Negative    Negative  Negative    Negative   05/25/2021  2:14 PM Negative  Negative     Assessment: Paul Bridges comes in today to follow up after starting Descovy  for HIV PrEP back in May. From review of dispense report, and it appears he only filled in May.   Plan: ***  Hilery Wintle L. Mysti Haley, PharmD, BCIDP, AAHIVP, CPP Clinical Pharmacist Practitioner - Infectious Diseases Clinical Pharmacist Lead - Specialty Pharmacy [facility] for Infectious Disease

## 2023-12-15 ENCOUNTER — Ambulatory Visit: Payer: Self-pay | Admitting: Pharmacist

## 2023-12-15 DIAGNOSIS — Z113 Encounter for screening for infections with a predominantly sexual mode of transmission: Secondary | ICD-10-CM

## 2023-12-15 DIAGNOSIS — Z79899 Other long term (current) drug therapy: Secondary | ICD-10-CM

## 2023-12-16 DIAGNOSIS — Z419 Encounter for procedure for purposes other than remedying health state, unspecified: Secondary | ICD-10-CM | POA: Diagnosis not present

## 2023-12-17 ENCOUNTER — Ambulatory Visit
Admission: RE | Admit: 2023-12-17 | Discharge: 2023-12-17 | Disposition: A | Discharge status: Home / Self Care | Source: Ambulatory Visit | Attending: Primary Care | Admitting: Primary Care

## 2023-12-17 ENCOUNTER — Ambulatory Visit (INDEPENDENT_AMBULATORY_CARE_PROVIDER_SITE_OTHER): Admitting: Primary Care

## 2023-12-17 ENCOUNTER — Encounter: Payer: Self-pay | Admitting: Primary Care

## 2023-12-17 VITALS — BP 124/64 | HR 61 | Temp 98.0°F | Ht 74.29 in | Wt 253.4 lb

## 2023-12-17 DIAGNOSIS — Z113 Encounter for screening for infections with a predominantly sexual mode of transmission: Secondary | ICD-10-CM | POA: Diagnosis not present

## 2023-12-17 DIAGNOSIS — M25462 Effusion, left knee: Secondary | ICD-10-CM | POA: Diagnosis not present

## 2023-12-17 DIAGNOSIS — J351 Hypertrophy of tonsils: Secondary | ICD-10-CM | POA: Insufficient documentation

## 2023-12-17 DIAGNOSIS — G8929 Other chronic pain: Secondary | ICD-10-CM

## 2023-12-17 DIAGNOSIS — M25562 Pain in left knee: Secondary | ICD-10-CM

## 2023-12-17 NOTE — Patient Instructions (Signed)
 Complete xray(s) and labs prior to leaving today. I will notify you of your results once received.  Schedule a visit with Dr. Watt, our sports medicine doctor.  You will either be contacted via phone regarding your referral to ENT, or you may receive a letter on your MyChart portal from our referral team with instructions for scheduling an appointment. Please let us  know if you have not been contacted by anyone within two weeks.  It was a pleasure to see you today!

## 2023-12-17 NOTE — Assessment & Plan Note (Signed)
 Without complication today.  We did discuss that the foods he mention within HPI are triggers for esophageal reflux.  He may have evidence of this on exam during his upcoming ENT visit.  Referral placed to ENT for further evaluation.

## 2023-12-17 NOTE — Assessment & Plan Note (Signed)
 No alarm signs on exam.  X-rays of the left knee ordered and pending. He will schedule an appointment with our sports medicine physician.

## 2023-12-17 NOTE — Progress Notes (Signed)
 Subjective:    Patient ID: Paul Bridges, male    DOB: [birth year], 23 y.o.   MRN: [id number]  Knee Pain  Pertinent negatives include no numbness.  Sore Throat     Paul Bridges is a very pleasant 23 y.o. male with a history of elevated creatinine, high risk sexual behavior, who presents today to discuss several concerns.  He would also like screening for STDs.  He is asymptomatic, with the same partner.  1) Knee pain: His pain is located to the left lower anterior knee for which he first noticed while playing sports in high school and college. He played football. His knee pain is intermittent. He originally thought his pain was due to weight gain last year. No improvement with weight loss.   Three weeks ago he was playing basketball, he jumped high, and noticed increased left knee pain when landing. There was no injury. His pain since then has been constant for which he describes as achy and stiff. His pain is worse when sitting with his knee flexed. He has to straighten hs knee out for relief.   He's taken Ibuprofen  and icing his knee with temporary improvement. He does notice swelling at times. He does use a knee sleeve when exercising which hasn't helped.   He denies numbness, injury, color changes, weakness.   2) Enlarged Tonsils: In high school he was told that he needed to have his tonsils removed due to recurrent inflammation with pain and tonsil stones.   Since high school he continues to experience these symptoms. Triggers include seafood, pineapple, chocolate, orange juice, and vitamin C type products cause symptoms. He denies chest pain, but he does have burning in his throat with these foods.      Review of Systems  HENT:  Negative for postnasal drip.        Enlarged tonsils, tonsil stones, throat irritation  Gastrointestinal:        Pharyngeal burning  Musculoskeletal:  Positive for arthralgias and joint swelling.  Skin:  Negative for color change.  Neurological:   Negative for weakness and numbness.         Past Medical History:  Diagnosis Date   Chronic tonsillitis    H/O testicular mass    Sore throat 01/24/2022    Social History   Socioeconomic History   Marital status: Single    Spouse name: Not on file   Number of children: Not on file   Years of education: Not on file   Highest education level: Not on file  Occupational History   Not on file  Tobacco Use   Smoking status: Never   Smokeless tobacco: Never  Vaping Use   Vaping status: Never Used  Substance and Sexual Activity   Alcohol use: Yes    Alcohol/week: 1.0 standard drink of alcohol    Types: 1 Glasses of wine per week   Drug use: Not Currently    Types: Marijuana    Comment: once every couple months   Sexual activity: Yes    Partners: Female    Birth control/protection: Condom    Comment: condoms sometimes  Other Topics Concern   Not on file  Social History Narrative   Single.   No children.   Works in Clinical biochemist.   Student at [employer]   Social Drivers of Health   Financial Resource Strain: Not on file  Food Insecurity: Not on file  Transportation Needs: Not on file  Physical Activity: Not on file  Stress: Not on file  Social Connections: Not on file  Intimate Partner Violence: Not on file    History reviewed. No pertinent surgical history.  Family History  Problem Relation Age of Onset   Diabetes Mother    Hyperlipidemia Mother    Hypertension Mother     Allergies  Allergen Reactions   Shellfish Allergy     No current outpatient medications on file prior to visit.   No current facility-administered medications on file prior to visit.    BP 124/64   Pulse 61   Temp 98 F (36.7 C) (Temporal)   Ht 6' 2.29 (1.887 m)   Wt 253 lb 6.4 oz (114.9 kg)   SpO2 96%   BMI 32.28 kg/m  Objective:   Physical Exam HENT:     Mouth/Throat:     Mouth: Mucous membranes are moist.     Pharynx: No oropharyngeal exudate or  posterior oropharyngeal erythema.     Comments: Tonsils enlarged without obvious infection.  Tonsil stones noted to right and left tonsils. Cardiovascular:     Rate and Rhythm: Normal rate and regular rhythm.  Pulmonary:     Effort: Pulmonary effort is normal.     Breath sounds: Normal breath sounds.  Musculoskeletal:     Cervical back: Neck supple.     Left knee: No bony tenderness. Normal range of motion. Tenderness present over the patellar tendon. No ACL laxity or PCL laxity.    Comments: Pain with left knee flexion of 45 degrees  Skin:    General: Skin is warm and dry.  Neurological:     Mental Status: He is alert and oriented to person, place, and time.  Psychiatric:        Mood and Affect: Mood normal.           Assessment & Plan:  Chronic pain of left knee Assessment & Plan: No alarm signs on exam.  X-rays of the left knee ordered and pending. He will schedule an appointment with our sports medicine physician.  Orders: -     DG Knee 4 Views W/Patella Left  Enlarged tonsils Assessment & Plan: Without complication today.  We did discuss that the foods he mention within HPI are triggers for esophageal reflux.  He may have evidence of this on exam during his upcoming ENT visit.  Referral placed to ENT for further evaluation.  Orders: -     Ambulatory referral to ENT  Screening for STD (sexually transmitted disease) Assessment & Plan: Labs pending today for urine gonorrhea, chlamydia, trichomonas.  He declines blood work for HIV, Hep C, RPR, HSV.  Orders: -     C. trachomatis/N. gonorrhoeae RNA -     Trichomonas vaginalis, RNA        Comer MARLA Gaskins, NP

## 2023-12-17 NOTE — Assessment & Plan Note (Signed)
 Labs pending today for urine gonorrhea, chlamydia, trichomonas.  He declines blood work for HIV, Hep C, RPR, HSV.

## 2023-12-18 LAB — TRICHOMONAS VAGINALIS, PROBE AMP: Trichomonas vaginalis RNA: NOT DETECTED

## 2023-12-18 LAB — C. TRACHOMATIS/N. GONORRHOEAE RNA
C. trachomatis RNA, TMA: NOT DETECTED
N. gonorrhoeae RNA, TMA: NOT DETECTED

## 2023-12-19 ENCOUNTER — Ambulatory Visit: Payer: Self-pay | Admitting: Primary Care

## 2024-01-16 DIAGNOSIS — Z419 Encounter for procedure for purposes other than remedying health state, unspecified: Secondary | ICD-10-CM | POA: Diagnosis not present

## 2024-02-20 ENCOUNTER — Telehealth (INDEPENDENT_AMBULATORY_CARE_PROVIDER_SITE_OTHER): Payer: Self-pay | Admitting: Otolaryngology

## 2024-02-20 NOTE — Telephone Encounter (Signed)
 Lvm to rs appt due to provider

## 2024-02-24 ENCOUNTER — Institutional Professional Consult (permissible substitution) (INDEPENDENT_AMBULATORY_CARE_PROVIDER_SITE_OTHER): Admitting: Otolaryngology

## 2024-02-25 ENCOUNTER — Encounter (INDEPENDENT_AMBULATORY_CARE_PROVIDER_SITE_OTHER): Payer: Self-pay

## 2024-02-25 ENCOUNTER — Ambulatory Visit (INDEPENDENT_AMBULATORY_CARE_PROVIDER_SITE_OTHER)

## 2024-02-25 VITALS — BP 103/61 | HR 48 | Ht 74.0 in | Wt 260.0 lb

## 2024-02-25 DIAGNOSIS — J358 Other chronic diseases of tonsils and adenoids: Secondary | ICD-10-CM

## 2024-02-25 DIAGNOSIS — J3501 Chronic tonsillitis: Secondary | ICD-10-CM

## 2024-02-25 DIAGNOSIS — J351 Hypertrophy of tonsils: Secondary | ICD-10-CM

## 2024-02-25 NOTE — Progress Notes (Signed)
 Dear Dr. Gretta, Here is my assessment for our mutual patient, Paul Bridges. Thank you for allowing me the opportunity to care for your patient. Please do not hesitate to contact me should you have any other questions. Sincerely, Dr. Hadassah Parody  Otolaryngology Clinic Note Referring provider: Dr. Gretta HPI:   Initial HPI (02/25/2024) Discussed the use of AI scribe software for clinical note transcription with the patient, who gave verbal consent to proceed.  History of Present Illness Paul Bridges is a 23 year old male with recurrent tonsillitis who presents for evaluation for tonsillectomy  Tonsillar hypertrophy and tonsillolithiasis Recurrent tonsillitis  - Persistent tonsillar swelling ongoing for years, with a recent episode lasting until last week, feels like episodes are getting worse  - Frequent tonsil stones - Occasional severe tonsillar pain, sometimes severe enough to cause avoidance of talking, difficulty eating at times  - multiple episodes of tonsillitis over the years, takes antibiotics each time and seem sto help   Hematologic history - Sickle cell trait identified during routine blood work in college - No history of sickle cell crises - Occasional low iron levels, managed with iron supplementation  Systemic review - No use of blood thinners - No significant cardiac, pulmonary, or renal disease    Independent Review of Additional Tests or Records:  Referral note Paul Gretta, NP (12/17/23): enlarged tonsils, schedule with ENT   Note from Medford Angle, MD (08/06/19): pt with chronic tonsillitis, scheduled for tonsillectomy, pt cancelled  PMH/Meds/All/SocHx/FamHx/ROS:   Past Medical History:  Diagnosis Date   Chronic tonsillitis    H/O testicular mass    Sore throat 01/24/2022     No past surgical history on file.  Family History  Problem Relation Age of Onset   Diabetes Mother    Hyperlipidemia Mother    Hypertension Mother      Social  Connections: Not on file     No current outpatient medications   Physical Exam:   BP 103/61   Pulse (!) 48   Ht 6' 2 (1.88 m)   Wt 260 lb (117.9 kg)   SpO2 95%   BMI 33.38 kg/m   Salient findings:  CN II-XII intact Bilateral EAC clear and TM intact with well pneumatized middle ear spaces No lesions of oral cavity/oropharynx; 2-3+ cryptic tonsils No obviously palpable neck masses/lymphadenopathy/thyromegaly No respiratory distress or stridor  Seprately Identifiable Procedures:  Prior to initiating any procedures, risks/benefits/alternatives were explained to the patient and verbal consent obtained. None  Impression & Plans:  Paul Bridges is a 23 y.o. male with   1. Chronic tonsillitis   2. Tonsillith   3. Tonsillar hypertrophy    Assessment and Plan Assessment & Plan Chronic tonsillitis with recurrent tonsil stones Chronic tonsillitis with recurrent tonsil stones causing discomfort and foreign body sensation He was offered tonsillectomy in the past but had to cancel due to scheduling conflicts - Order tonsillectomy. - Advised accompaniment on surgery day for transportation due to anesthesia. - Informed about 3% risk of postoperative bleeding and need for emergency care if significant bleeding occurs.  We discussed management for this, including observation, antibiotics, and tonsillectomy.  We discussed R/B/A for Tonsillectomy including significant post-op pain, bleeding (3%, including life threatening bleeding and requiring return to OR), and infections (still with pharyngitis) as well as persistent symptoms and risk of anesthesia. We also discussed post-op management and risks.  Patient would like to proceed with Tonsillectomy.   See below regarding exact medications prescribed this encounter including dosages and  route: No orders of the defined types were placed in this encounter.     Thank you for allowing me the opportunity to care for your patient. Please do  not hesitate to contact me should you have any other questions.  Sincerely, Hadassah Parody, MD Otolaryngologist (ENT), [facility] Phone: [phone] Fax: [phone]

## 2024-03-09 ENCOUNTER — Telehealth (INDEPENDENT_AMBULATORY_CARE_PROVIDER_SITE_OTHER): Payer: Self-pay

## 2024-03-09 HISTORY — PX: OTHER SURGICAL HISTORY: SHX169

## 2024-03-09 NOTE — Telephone Encounter (Signed)
 Pt did not show for tonsillectomy this morning. Nursing staff and myself were not able to get in touch with him at the time of surgery and so was cancelled.

## 2024-03-25 ENCOUNTER — Encounter (INDEPENDENT_AMBULATORY_CARE_PROVIDER_SITE_OTHER): Payer: Self-pay

## 2024-03-25 ENCOUNTER — Encounter (INDEPENDENT_AMBULATORY_CARE_PROVIDER_SITE_OTHER)
